# Patient Record
Sex: Male | Born: 1990 | Race: Black or African American | Marital: Single | Smoking: Current every day smoker
Health system: Southern US, Community
[De-identification: ages and names within clinical notes are randomized; demographics above are authoritative.]

## PROBLEM LIST (undated history)

## (undated) DIAGNOSIS — A749 Chlamydial infection, unspecified: Secondary | ICD-10-CM

---

## 2014-02-20 ENCOUNTER — Emergency Department (HOSPITAL_COMMUNITY)
Admission: EM | Admit: 2014-02-20 | Discharge: 2014-02-20 | Disposition: A | Discharge status: Home / Self Care | Payer: Self-pay | Attending: Emergency Medicine | Admitting: Emergency Medicine

## 2014-02-20 ENCOUNTER — Encounter (HOSPITAL_COMMUNITY): Payer: Self-pay | Admitting: Emergency Medicine

## 2014-02-20 ENCOUNTER — Emergency Department (HOSPITAL_COMMUNITY): Payer: Self-pay

## 2014-02-20 DIAGNOSIS — J069 Acute upper respiratory infection, unspecified: Secondary | ICD-10-CM | POA: Insufficient documentation

## 2014-02-20 DIAGNOSIS — R197 Diarrhea, unspecified: Secondary | ICD-10-CM | POA: Insufficient documentation

## 2014-02-20 MED ORDER — GUAIFENESIN-DM 100-10 MG/5ML PO SYRP: 5.0000 mL | ORAL_SOLUTION | ORAL | Status: DC | PRN

## 2014-02-20 MED ORDER — ALBUTEROL SULFATE HFA 108 (90 BASE) MCG/ACT IN AERS
2.0000 | INHALATION_SPRAY | Freq: Once | RESPIRATORY_TRACT | Status: AC
  Administered 2014-02-20: 2 via RESPIRATORY_TRACT
  Filled 2014-02-20: qty 6.7

## 2014-02-20 NOTE — ED Notes (Signed)
Back from xray

## 2014-02-20 NOTE — Discharge Instructions (Signed)
Use albuterol inhaler 2 puffs every 4-6 hours  Take cough medication as needed  Ibuprofen 600-800 mg for chest wall pain  Avoid tobacco use! Return to the emergency department if you develop any changing/worsening condition, fever, difficulty breathing, repeated vomiting, coughing up blood, or any other concerns (please read additional information regarding your condition below)     Upper Respiratory Infection, Adult An upper respiratory infection (URI) is also known as the common cold. It is often caused by a type of germ (virus). Colds are easily spread (contagious). You can pass it to others by kissing, coughing, sneezing, or drinking out of the same glass. Usually, you get better in 1 or 2 weeks.  HOME CARE   Only take medicine as told by your doctor.  Use a warm mist humidifier or breathe in steam from a hot shower.  Drink enough water and fluids to keep your pee (urine) clear or pale yellow.  Get plenty of rest.  Return to work when your temperature is back to normal or as told by your doctor. You may use a face mask and wash your hands to stop your cold from spreading. GET HELP RIGHT AWAY IF:   After the first few days, you feel you are getting worse.  You have questions about your medicine.  You have chills, shortness of breath, or brown or red spit (mucus).  You have yellow or brown snot (nasal discharge) or pain in the face, especially when you bend forward.  You have a fever, puffy (swollen) neck, pain when you swallow, or white spots in the back of your throat.  You have a bad headache, ear pain, sinus pain, or chest pain.  You have a high-pitched whistling sound when you breathe in and out (wheezing).  You have a lasting cough or cough up blood.  You have sore muscles or a stiff neck. MAKE SURE YOU:   Understand these instructions.  Will watch your condition.  Will get help right away if you are not doing well or get worse. Document Released: 04/06/2008  Document Revised: 01/11/2012 Document Reviewed: 02/23/2011 [facility] Patient Information 2014 [place], [place].   Cough, Adult  A cough is a reflex that helps clear your throat and airways. It can help heal the body or may be a reaction to an irritated airway. A cough may only last 2 or 3 weeks (acute) or may last more than 8 weeks (chronic).  CAUSES Acute cough:  Viral or bacterial infections. Chronic cough:  Infections.  Allergies.  Asthma.  Post-nasal drip.  Smoking.  Heartburn or acid reflux.  Some medicines.  Chronic lung problems (COPD).  Cancer. SYMPTOMS   Cough.  Fever.  Chest pain.  Increased breathing rate.  High-pitched whistling sound when breathing (wheezing).  Colored mucus that you cough up (sputum). TREATMENT   A bacterial cough may be treated with antibiotic medicine.  A viral cough must run its course and will not respond to antibiotics.  Your caregiver may recommend other treatments if you have a chronic cough. HOME CARE INSTRUCTIONS   Only take over-the-counter or prescription medicines for pain, discomfort, or fever as directed by your caregiver. Use cough suppressants only as directed by your caregiver.  Use a cold steam vaporizer or humidifier in your bedroom or home to help loosen secretions.  Sleep in a semi-upright position if your cough is worse at night.  Rest as needed.  Stop smoking if you smoke. SEEK IMMEDIATE MEDICAL CARE IF:   You have pus  in your sputum.  Your cough starts to worsen.  You cannot control your cough with suppressants and are losing sleep.  You begin coughing up blood.  You have difficulty breathing.  You develop pain which is getting worse or is uncontrolled with medicine.  You have a fever. MAKE SURE YOU:   Understand these instructions.  Will watch your condition.  Will get help right away if you are not doing well or get worse. Document Released: 04/17/2011 Document Revised: 01/11/2012  Document Reviewed: 04/17/2011 [facility] Patient Information 2014 [place], [place].  Chest Wall Pain Chest wall pain is pain in or around the bones and muscles of your chest. It may take up to 6 weeks to get better. It may take longer if you must stay physically active in your work and activities.  CAUSES  Chest wall pain may happen on its own. However, it may be caused by:  A viral illness like the flu.  Injury.  Coughing.  Exercise.  Arthritis.  Fibromyalgia.  Shingles. HOME CARE INSTRUCTIONS   Avoid overtiring physical activity. Try not to strain or perform activities that cause pain. This includes any activities using your chest or your abdominal and side muscles, especially if heavy weights are used.  Put ice on the sore area.  Put ice in a plastic bag.  Place a towel between your skin and the bag.  Leave the ice on for 15-20 minutes per hour while awake for the first 2 days.  Only take over-the-counter or prescription medicines for pain, discomfort, or fever as directed by your caregiver. SEEK IMMEDIATE MEDICAL CARE IF:   Your pain increases, or you are very uncomfortable.  You have a fever.  Your chest pain becomes worse.  You have new, unexplained symptoms.  You have nausea or vomiting.  You feel sweaty or lightheaded.  You have a cough with phlegm (sputum), or you cough up blood. MAKE SURE YOU:   Understand these instructions.  Will watch your condition.  Will get help right away if you are not doing well or get worse. Document Released: 10/19/2005 Document Revised: 01/11/2012 Document Reviewed: 06/15/2011 [facility] Patient Information 2014 [place], [place].  Smoking Cessation Quitting smoking is important to your health and has many advantages. However, it is not always easy to quit since nicotine is a very addictive drug. Often times, people try 3 times or more before being able to quit. This document explains the best ways for you to prepare to quit  smoking. Quitting takes hard work and a lot of effort, but you can do it. ADVANTAGES OF QUITTING SMOKING  You will live longer, feel better, and live better.  Your body will feel the impact of quitting smoking almost immediately.  Within 20 minutes, blood pressure decreases. Your pulse returns to its normal level.  After 8 hours, carbon monoxide levels in the blood return to normal. Your oxygen level increases.  After 24 hours, the chance of having a heart attack starts to decrease. Your breath, hair, and body stop smelling like smoke.  After 48 hours, damaged nerve endings begin to recover. Your sense of taste and smell improve.  After 72 hours, the body is virtually free of nicotine. Your bronchial tubes relax and breathing becomes easier.  After 2 to 12 weeks, lungs can hold more air. Exercise becomes easier and circulation improves.  The risk of having a heart attack, stroke, cancer, or lung disease is greatly reduced.  After 1 year, the risk of coronary heart disease is  cut in half.  After 5 years, the risk of stroke falls to the same as a nonsmoker.  After 10 years, the risk of lung cancer is cut in half and the risk of other cancers decreases significantly.  After 15 years, the risk of coronary heart disease drops, usually to the level of a nonsmoker.  If you are pregnant, quitting smoking will improve your chances of having a healthy baby.  The people you live with, especially any children, will be healthier.  You will have extra money to spend on things other than cigarettes. QUESTIONS TO THINK ABOUT BEFORE ATTEMPTING TO QUIT You may want to talk about your answers with your caregiver.  Why do you want to quit?  If you tried to quit in the past, what helped and what did not?  What will be the most difficult situations for you after you quit? How will you plan to handle them?  Who can help you through the tough times? Your family? Friends? A caregiver?  What  pleasures do you get from smoking? What ways can you still get pleasure if you quit? Here are some questions to ask your caregiver:  How can you help me to be successful at quitting?  What medicine do you think would be best for me and how should I take it?  What should I do if I need more help?  What is smoking withdrawal like? How can I get information on withdrawal? GET READY  Set a quit date.  Change your environment by getting rid of all cigarettes, ashtrays, matches, and lighters in your home, car, or work. Do not let people smoke in your home.  Review your past attempts to quit. Think about what worked and what did not. GET SUPPORT AND ENCOURAGEMENT You have a better chance of being successful if you have help. You can get support in many ways.  Tell your family, friends, and co-workers that you are going to quit and need their support. Ask them not to smoke around you.  Get individual, group, or telephone counseling and support. Programs are available at [employer] and health centers. Call your local health department for information about programs in your area.  Spiritual beliefs and practices may help some smokers quit.  Download a "quit meter" on your computer to keep track of quit statistics, such as how long you have gone without smoking, cigarettes not smoked, and money saved.  Get a self-help book about quitting smoking and staying off of tobacco. LEARN NEW SKILLS AND BEHAVIORS  Distract yourself from urges to smoke. Talk to someone, go for a walk, or occupy your time with a task.  Change your normal routine. Take a different route to work. Drink tea instead of coffee. Eat breakfast in a different place.  Reduce your stress. Take a hot bath, exercise, or read a book.  Plan something enjoyable to do every day. Reward yourself for not smoking.  Explore interactive web-based programs that specialize in helping you quit. GET MEDICINE AND USE IT  CORRECTLY Medicines can help you stop smoking and decrease the urge to smoke. Combining medicine with the above behavioral methods and support can greatly increase your chances of successfully quitting smoking.  Nicotine replacement therapy helps deliver nicotine to your body without the negative effects and risks of smoking. Nicotine replacement therapy includes nicotine gum, lozenges, inhalers, nasal sprays, and skin patches. Some may be available over-the-counter and others require a prescription.  Antidepressant medicine helps people abstain from  smoking, but how this works is unknown. This medicine is available by prescription.  Nicotinic receptor partial agonist medicine simulates the effect of nicotine in your brain. This medicine is available by prescription. Ask your caregiver for advice about which medicines to use and how to use them based on your health history. Your caregiver will tell you what side effects to look out for if you choose to be on a medicine or therapy. Carefully read the information on the package. Do not use any other product containing nicotine while using a nicotine replacement product.  RELAPSE OR DIFFICULT SITUATIONS Most relapses occur within the first 3 months after quitting. Do not be discouraged if you start smoking again. Remember, most people try several times before finally quitting. You may have symptoms of withdrawal because your body is used to nicotine. You may crave cigarettes, be irritable, feel very hungry, cough often, get headaches, or have difficulty concentrating. The withdrawal symptoms are only temporary. They are strongest when you first quit, but they will go away within 10 14 days. To reduce the chances of relapse, try to:  Avoid drinking alcohol. Drinking lowers your chances of successfully quitting.  Reduce the amount of caffeine you consume. Once you quit smoking, the amount of caffeine in your body increases and can give you symptoms, such  as a rapid heartbeat, sweating, and anxiety.  Avoid smokers because they can make you want to smoke.  Do not let weight gain distract you. Many smokers will gain weight when they quit, usually less than 10 pounds. Eat a healthy diet and stay active. You can always lose the weight gained after you quit.  Find ways to improve your mood other than smoking. FOR MORE INFORMATION  www.smokefree.gov  Document Released: 10/13/2001 Document Revised: 04/19/2012 Document Reviewed: 01/28/2012 [facility] Patient Information 2014 [place], [place].   Emergency Department Resource Guide 1) Find a Doctor and Pay Out of Pocket Although you won't have to find out who is covered by your insurance plan, it is a good idea to ask around and get recommendations. You will then need to call the office and see if the doctor you have chosen will accept you as a new patient and what types of options they offer for patients who are self-pay. Some doctors offer discounts or will set up payment plans for their patients who do not have insurance, but you will need to ask so you aren't surprised when you get to your appointment.  2) Contact Your Local Health Department Not all health departments have doctors that can see patients for sick visits, but many do, so it is worth a call to see if yours does. If you don't know where your local health department is, you can check in your phone book. The CDC also has a tool to help you locate your state's health department, and many state websites also have listings of all of their local health departments.  3) Find a Walk-in Clinic If your illness is not likely to be very severe or complicated, you may want to try a walk in clinic. These are popping up all over the country in pharmacies, drugstores, and shopping centers. They're usually staffed by nurse practitioners or physician assistants that have been trained to treat common illnesses and complaints. They're usually fairly quick and  inexpensive. However, if you have serious medical issues or chronic medical problems, these are probably not your best option.  No Primary Care Doctor: - Call Health Connect at  [phone] -  they can help you locate a primary care doctor that  accepts your insurance, provides certain services, etc. - Physician Referral Service- [phone]  Chronic Pain Problems: Organization         Address  Phone   Notes  [facility]  [phone] Patients need to be referred by their primary care doctor.   Medication Assistance: Organization         Address  Phone   Notes  [facility] [address] [place], [place] [address] [phone] --Must be a resident of [facility] -- Must have NO insurance coverage whatsoever (no Medicaid/ Medicare, etc.) -- The pt. MUST have a primary care doctor that directs their care regularly and follows them in the community   [facility]  [phone]   [employer]  [phone]    Agencies that provide inexpensive medical care: Organization         Address  Phone   Notes  [facility]  [phone]   [facility]    [phone]   [facility] [address] [place], [place] [address] [phone]   [facility] [address], [place] [phone]   Planned Parenthood    [phone]   [facility]    [phone]   [facility]  [address], [place] Phone:  [phone], Fax:  [phone] Hours of Operation:  9 am - 6 pm, M-F.  Also accepts Medicaid/Medicare and self-pay.  [facility]  [address], [place] Phone: [phone], Fax: [phone]. Hours of Operation:  8:30 am - 5:30 pm, M-F.  Also accepts Medicaid and self-pay.  [facility] [place] [address], [place] Phone: [phone]   [facility] [address], [place], [place] [phone], Ext. [phone] Mondays & Thursdays: 7-9 AM.  First 15 patients are seen on a first come, first serve basis.    Medicaid-accepting [facility] Providers:  Organization         Address  Phone   Notes  [facility] [address], [place] [phone] Also accepts self-pay patients.  [facility] [address], [place]  [phone]   [facility] [address], [place] [phone]   [facility] [address] [place] [address], [place] [phone]   Renaye RakersVeita Bland [address], [place]   [phone] Only accepts [place] Access [place] patients after they have their name applied to their card.   Self-Pay (no insurance) in [place] [facility] [place]:  Organization         Address  Phone   Notes  Sickle Cell Patients, [facility] [address], [place] [phone]   [facility] [address], [place] [phone]   [facility] [place]  [address], [place] [phone]   [facility]/Dr. Osei-Bonsu  [address] [place] [address], [place] or [id number], [place] [phone] Phone number for both [place] and [place] locations is the same.  [facility] [address], [place] [phone]   [facility] [address] [place] [address], [place] or [address] [phone]   [facility] [address] [place] [phone], phone; [phone], fax Sees patients 1st and 3rd Saturday of every month.  Must not qualify for public or private insurance (i.e. Medicaid, Medicare, [place] Health Choice, Veterans' Benefits)  Household income should be no more than 200% of the poverty level The clinic cannot treat you if you are pregnant or  think you are pregnant  Sexually transmitted diseases are not treated at the clinic.    Dental Care: Organization         Address  Phone  Notes  [facility] [address] [place], [place] [phone] Accepts children up to age 5 who are enrolled in [place] or [place] Health Choice; pregnant women with a Medicaid card; and children who have applied for Medicaid or [place] Health Choice, but were declined, whose parents can pay a reduced fee at time of service.  [facility] Department of [facility]  [address], [place] [phone] Accepts children up to age 64 who are enrolled in [place] or [place] Health Choice; pregnant women with a Medicaid card; and children who have applied for Medicaid or  Health Choice, but were declined, whose parents can pay a reduced fee at time of service.  [facility]  [address] [place], [place] [phone] Patients are seen by appointment only. Walk-ins are not accepted. [facility] will see patients 35 years of age and older. Monday - Tuesday (8am-5pm) Most Wednesdays (8:30-5pm) $30 per visit, cash only  [facility] Access PRO[facility]  [address], [facility] [phone] Patients are seen by appointment only. Walk-ins are not accepted. [facility] will see patients 1 years of age and older. One Wednesday Evening (Monthly: Volunteer Based).  $30 per visit, cash only  [employer]  [phone] for adults; Children under age 48, call Graduate Pediatric Dentistry at [phone]. Children aged 30-14, please call [phone] to request a pediatric application.  Dental services are provided in all areas of dental care including fillings, crowns and bridges, complete and partial dentures, implants, gum treatment, root canals, and extractions. Preventive care is also provided. Treatment is provided to both adults  and children. Patients are selected via a lottery and there is often a waiting list.   [facility] [address], [place]  [phone] [email or web address]   [facility] [address] [place], [place] [phone], Ext. [phone] Second and Fourth Thursday of each month, opens at 6:30 AM; Clinic ends at 9 AM.  Patients are seen on a first-come first-served basis, and a limited number are seen during each clinic.   [facility]  [address] [place], [place] [phone]   Eligibility Requirements You must have lived in [place], [place], or [place] counties for at least the last three months.   You cannot be eligible for state or federal sponsored [place], including [employer], [place], or [employer].   You generally cannot be eligible for healthcare insurance through your employer.    How to apply: Eligibility screenings are held every Tuesday and Wednesday afternoon from 1:00 pm until 4:00 pm. You do not need an appointment for the interview!  [facility] [address], [place], [place] [phone]   [facility] Health Department  [phone]   [facility]  Department  [phone]   [facility] Health Department  [phone]    Behavioral Health Resources in the Community: Intensive Outpatient Programs Organization         Address  Phone  Notes  [facility] [address], [place], [place] [phone]   [facility] [address], [place], [place] [phone]   [facility] [address], [place], [place]  [phone]   [facility] [address],  [place], [place] [phone] or [phone]   Substance Abuse Resources Organization         Address  Phone  Notes  [facility]  [phone]   [facility]  [phone]   [facility]  [phone]    [facility]  [phone]   Residential & Outpatient Substance Abuse Program  [phone]   Psychological Services Organization         Address  Phone  Notes  [facility]  [phone]   [facility]  [phone]   [facility] [address], [place] [phone] or [phone]    Mobile Crisis Teams Organization         Address  Phone  Notes  [facility], Mobile Crisis Care Unit  [phone]   [facility]  [address] [place], [place] [phone]   Doristine LocksSharon DeEsch [address] [place] [phone]    Self-Help/Support Groups Organization         Address  Phone             Notes  [facility] [place] - variety of support groups  336- [phone] Call for more information  Narcotics Anonymous (NA), [facility] [address], [place]  2 meetings at this location   [facility] Organization         Address  Phone  Notes  [facility] [address],    [place]  [phone]   [facility]  [address], [place] [phone], [place], [place] [phone]   [facility] [address], [place] [phone] Admissions: 8am-3pm M-F  [facility] [address]    [place], [place] [phone]   [facility] [address], [place], [place] [phone]   [facility] [address]  [place], [place] [phone]   [facility] Intensive Outpatient [address], [place], [place] [phone]   [facility] [address]  [place], [place] [phone] or [phone]   [facility] ([facility] [address] [place], [place] [phone] Accepts Medicaid  [facility] [address]  [place] [phone] Substance Abuse/Addiction Treatment   [facility]  [facility] Resources Organization         Address  Phone  Notes  [facility]  [phone]   Angie FavaJulie Brannon, PhD [address], [address] [place], [place]   [phone]) [phone] or [phone]) [phone]   [facility]   [address] [place], [place] [phone]) [phone]   [facility] [address], [place], [place] [phone]) [phone] Insurance/Medicaid/sponsorship through [facility]  [facility] [address]  [place], [place] [phone] [place], [place] [phone]    Dr. Adele Schilder  [phone]   [facility] 1) [address] [place] 2) [place] 3)  [address], [place] [phone]  [phone]   [place] [phone] or [phone] (After Hours)

## 2014-02-20 NOTE — ED Notes (Signed)
Patient transported to X-ray 

## 2014-02-20 NOTE — ED Notes (Signed)
Pt in c/o cough and congestion, chest pain with coughing, states it feels like when he had bronchitis last, denies fever, no distress noted

## 2014-02-20 NOTE — ED Notes (Signed)
Onset 3 days productive cough- yellow mucus, nasal congestion, runny nose- clear drainage, sore throat.  Onset yesterday chest pain with coughing and deep breaths, no episodes of chest pain since this am.  Onset yesterday lower back pain, worse with coughing.  Diarrhea x 1 last night.  Decreased appetite.  No respiratory or swallowing difficulties.  Had bronchitis in Jan, pt states his symptoms seem to be same as then.  Lungs clear.

## 2014-02-20 NOTE — ED Provider Notes (Signed)
CSN: [id number]     Arrival date & time 02/20/14  1934 History   This chart was scribed for non-physician practitioner, Coral CeoJessica Mandeep Kiser, PA-C, working with Gavin PoundMichael Y. Oletta LamasGhim, MD by Smiley HousemanFallon Davis, ED Scribe. This patient was seen in room TR11C/TR11C and the patient's care was started at 9:52 PM.   Chief Complaint  Patient presents with  . Cough   The history is provided by the patient. No language interpreter was used.   HPI Comments: Shannon Hardy is a 23 y.o. male with no PMH who presents to the Emergency Department complaining of a persistent cough that started about 4 days ago. Pt states his cough is productive of light yellow mucous. No hemoptysis. Pt has associated nasal congestion and scratchy throat from couging. Pt has mid-sternal chest pain only with coughing. Denies SOB or wheezing. Pt is experiencing diarrhea and states his appetite has decreased. Pt denies fevers, but states he has chills. ED temperature is 98.40F. Pt states he tried swallowing cayenne pepper without relief. Pt states he had similar symptoms when he had bronchitis in January. No history of DVT, PE, clotting disorder, cancer, recent surgery, immobilization, travel. He states he is otherwise healthy. Pt states he is a current smoker.    History reviewed. No pertinent past medical history. No past surgical history on file. No family history on file. History  Substance Use Topics  . Smoking status: Not on file  . Smokeless tobacco: Not on file  . Alcohol Use: Not on file    Review of Systems  Constitutional: Positive for chills and appetite change. Negative for fever and fatigue.  HENT: Positive for congestion. Negative for ear pain, sinus pressure and sore throat.   Respiratory: Positive for cough. Negative for chest tightness, shortness of breath and wheezing.   Cardiovascular: Positive for chest pain.  Gastrointestinal: Positive for diarrhea. Negative for nausea, vomiting and abdominal pain.  Musculoskeletal:  Negative for myalgias.  Skin: Negative for color change and rash.  Neurological: Negative for weakness and headaches.  All other systems reviewed and are negative.   Allergies  Review of patient's allergies indicates no known allergies.  Home Medications   Prior to Admission medications   Not on File   Triage Vitals: BP 133/78  Pulse 87  Temp(Src) 98.5 F (36.9 C)  Resp 20  Ht 6\' 2"  (1.88 m)  Wt 180 lb (81.647 kg)  BMI 23.10 kg/m2  SpO2 100%  Filed Vitals:   02/20/14 1941 02/20/14 2202  BP: 133/78 132/58  Pulse: 87 82  Temp: 98.5 F (36.9 C)   Resp: 20 20  Height: 6\' 2"  (1.88 m)   Weight: 180 lb (81.647 kg)   SpO2: 100% 99%    Physical Exam  Nursing note and vitals reviewed. Constitutional: He is oriented to person, place, and time. He appears well-developed and well-nourished. No distress.  Non-toxic  HENT:  Head: Normocephalic and atraumatic.  Right Ear: External ear normal.  Left Ear: External ear normal.  Nose: Nose normal.  Mouth/Throat: Oropharynx is clear and moist. No oropharyngeal exudate.  No erythema to the posterior pharynx. Tonsils without edema or exudates. Uvula midline. No trismus. No difficulty controlling secretions. Tympanic membranes gray and translucent bilaterally with no erythema, edema, or hemotympanum.  No mastoid or tragal tenderness bilaterally.   Eyes: Conjunctivae are normal. Pupils are equal, round, and reactive to light. Right eye exhibits no discharge. Left eye exhibits no discharge.  Neck: Normal range of motion. Neck supple.  No cervical lymphadenopathy.  No nuchal rigidity.   Cardiovascular: Normal rate, regular rhythm and normal heart sounds.  Exam reveals no gallop and no friction rub.   No murmur heard. Pulmonary/Chest: Effort normal and breath sounds normal. No respiratory distress. He has no wheezes. He has no rales. He exhibits tenderness.  Mid-sternal tenderness to palpation  Abdominal: Soft. He exhibits no distension.  There is no tenderness.  Musculoskeletal: Normal range of motion. He exhibits no edema and no tenderness.  No LE edema or calf tenderness bilaterally  Neurological: He is alert and oriented to person, place, and time.  Skin: Skin is warm and dry. He is not diaphoretic.    ED Course  Procedures (including critical care time) DIAGNOSTIC STUDIES: Oxygen Saturation is 100% on RA, normal by my interpretation.    COORDINATION OF CARE: 9:58 PM-Informed pt his chest x-ray was negative.  Will discharge with Robitussin and an inhaler.  Patient informed of current plan of treatment and evaluation and agrees with plan.    Imaging Review Dg Chest 2 View  02/20/2014   CLINICAL DATA:  COUGH  EXAM: CHEST  2 VIEW  COMPARISON:  None.  FINDINGS: The heart size and mediastinal contours are within normal limits. Both lungs are clear. The visualized skeletal structures are unremarkable.  IMPRESSION: No active cardiopulmonary disease.   Electronically Signed   By: Salome HolmesHector  Cooper M.D.   On: 02/20/2014 20:31    Date: 02/23/2014  Rate: 84  Rhythm: normal sinus rhythm  QRS Axis: normal  Intervals: normal  ST/T Wave abnormalities: normal  Conduction Disutrbances:none  Narrative Interpretation:   Old EKG Reviewed: none available   MDM   Shannon Hardy is a 23 y.o. male with no PMH who presents to the Emergency Department complaining of a persistent cough that started about 4 days ago. Symptoms likely due to a URI vs viral syndrome vs bronchitis. Patient afebrile and non-toxic in appearance. Chest x-ray negative for an acute cardiopulmonary process. EKG done triage. EKG negative for any acute ischemic changes. Chest pain unlikely cardiac. Reproducible on exam and only with coughing. Vital signs stable. No hypoxia, respiratory distress, or tachypnea. Patient had improvement with albuterol inhaler during ED visit. Instructed patient to follow-up with PCP for further evaluation and management. Return precautions,  discharge instructions, and follow-up was discussed with the patient before discharge.    Rechecks  10:00 PM = Patient states he feels better after inhaler. Breathing improved. Lungs clear to auscultation.    Discharge Medication List as of 02/20/2014 10:02 PM    START taking these medications   Details  guaiFENesin-dextromethorphan (ROBITUSSIN DM) 100-10 MG/5ML syrup Take 5 mLs by mouth every 4 (four) hours as needed for cough., Starting 02/20/2014, Until Discontinued, Print        Final impressions: 1. URI (upper respiratory infection)      Greer EeJessica Katlin Kennah Hehr PA-C         Jillyn LedgerJessica K Dequon Schnebly, PA-C 02/23/14 2251

## 2014-02-24 NOTE — ED Provider Notes (Signed)
Medical screening examination/treatment/procedure(s) were performed by non-physician practitioner and as supervising physician I was immediately available for consultation/collaboration.  Zayonna Ayuso Y. Herby Amick, MD 02/24/14 1420 

## 2014-08-18 ENCOUNTER — Emergency Department (HOSPITAL_COMMUNITY)
Admission: EM | Admit: 2014-08-18 | Discharge: 2014-08-18 | Disposition: A | Discharge status: Home / Self Care | Payer: Self-pay | Attending: Emergency Medicine | Admitting: Emergency Medicine

## 2014-08-18 ENCOUNTER — Encounter (HOSPITAL_COMMUNITY): Payer: Self-pay | Admitting: Emergency Medicine

## 2014-08-18 DIAGNOSIS — Z720 Tobacco use: Secondary | ICD-10-CM | POA: Insufficient documentation

## 2014-08-18 DIAGNOSIS — R369 Urethral discharge, unspecified: Secondary | ICD-10-CM | POA: Insufficient documentation

## 2014-08-18 MED ORDER — AZITHROMYCIN 250 MG PO TABS
1000.0000 mg | ORAL_TABLET | Freq: Once | ORAL | Status: AC
  Administered 2014-08-18: 1000 mg via ORAL
  Filled 2014-08-18: qty 4

## 2014-08-18 MED ORDER — CEFTRIAXONE SODIUM 1 G IJ SOLR
1.0000 g | Freq: Once | INTRAMUSCULAR | Status: AC
  Administered 2014-08-18: 1 g via INTRAMUSCULAR
  Filled 2014-08-18: qty 10

## 2014-08-18 MED ORDER — ONDANSETRON 4 MG PO TBDP
4.0000 mg | ORAL_TABLET | Freq: Once | ORAL | Status: AC
  Administered 2014-08-18: 4 mg via ORAL
  Filled 2014-08-18: qty 1

## 2014-08-18 MED ORDER — LIDOCAINE HCL (PF) 1 % IJ SOLN
5.0000 mL | Freq: Once | INTRAMUSCULAR | Status: AC
  Administered 2014-08-18: 5 mL via INTRADERMAL
  Filled 2014-08-18: qty 5

## 2014-08-18 NOTE — Discharge Instructions (Signed)

## 2014-08-18 NOTE — ED Notes (Signed)
Noticed penile d/c today, denies other sx, reports unprotected sex with symptomatic person.

## 2014-08-18 NOTE — ED Provider Notes (Signed)
CSN: [id number]     Arrival date & time 08/18/14  2140 History  This chart was scribed for non-physician practitioner, Arthor CaptainAbigail Sahithi Ordoyne, PA-C,working with Flint MelterElliott L [place]z, MD, by Karle PlumberJennifer Tensley, ED Scribe. This patient was seen in room TR09C/TR09C and the patient's care was started at 10:27 PM.  Chief Complaint  Patient presents with  . Penile Discharge   Patient is a 23 y.o. male presenting with penile discharge. The history is provided by the patient. No language interpreter was used.  Penile Discharge   HPI Comments:  Shannon Hardy is a 23 y.o. male who presents to the Emergency Department complaining of clear penile discharge that the pt started today. He reports his partner reports she started having symptoms two days ago. Reports this has been his only sexual partner. He denies testicular pain, swelling or penile lesions.  History reviewed. No pertinent past medical history. History reviewed. No pertinent past surgical history. No family history on file. History  Substance Use Topics  . Smoking status: Current Every Day Smoker  . Smokeless tobacco: Not on file  . Alcohol Use: No    Review of Systems  Genitourinary: Positive for discharge.    Allergies  Review of patient's allergies indicates no known allergies.  Home Medications   Prior to Admission medications   Not on File   Triage Vitals: BP 134/88  Pulse 74  Temp(Src) 98.1 F (36.7 C) (Oral)  Resp 12  Ht 6\' 2"  (1.88 m)  Wt 200 lb (90.719 kg)  BMI 25.67 kg/m2  SpO2 98% Physical Exam  Nursing note and vitals reviewed. Constitutional: He is oriented to person, place, and time. He appears well-developed and well-nourished.  HENT:  Head: Normocephalic and atraumatic.  Eyes: EOM are normal.  Neck: Normal range of motion.  Cardiovascular: Normal rate.   Pulmonary/Chest: Effort normal.  Genitourinary: Testes normal and penis normal. No penile tenderness. No discharge found.  Normal genitalia with no lesions  present.  Musculoskeletal: Normal range of motion.  Neurological: He is alert and oriented to person, place, and time.  Skin: Skin is warm and dry.  Psychiatric: He has a normal mood and affect. His behavior is normal.    ED Course  Procedures (including critical care time) DIAGNOSTIC STUDIES: Oxygen Saturation is 98% on RA, normal by my interpretation.   COORDINATION OF CARE: 10:37 PM- Will check for GC/chlamydia and treat. Pt verbalizes understanding and agrees to plan.  Medications  azithromycin (ZITHROMAX) tablet 1,000 mg (1,000 mg Oral Given 08/18/14 2253)  ondansetron (ZOFRAN-ODT) disintegrating tablet 4 mg (4 mg Oral Given 08/18/14 2254)  cefTRIAXone (ROCEPHIN) injection 1 g (1 g Intramuscular Given 08/18/14 2254)  lidocaine (PF) (XYLOCAINE) 1 % injection 5 mL (5 mLs Intradermal Given 08/18/14 2301)    Labs Review Labs Reviewed  GC/CHLAMYDIA PROBE AMP  RPR  HIV ANTIBODY (ROUTINE TESTING)    Imaging Review No results found.   EKG Interpretation None      MDM   Final diagnoses:  Penile discharge   Patient here with c/o penile discharge. Treated with 1 g azithro and IM rocephin for chlamydia/GC. Discussed need for treatment of partner(s). Will be notified if  Positive results. May check my chart.     I personally performed the services described in this documentation, which was scribed in my presence. The recorded information has been reviewed and is accurate.   Arthor CaptainAbigail Caryle Helgeson, PA-C 08/22/14 1213

## 2014-08-19 LAB — RPR

## 2014-08-19 LAB — HIV ANTIBODY (ROUTINE TESTING W REFLEX): HIV: NONREACTIVE

## 2014-08-20 LAB — GC/CHLAMYDIA PROBE AMP
CT PROBE, AMP APTIMA: NEGATIVE
GC Probe RNA: NEGATIVE

## 2014-08-23 NOTE — ED Provider Notes (Signed)
Medical screening examination/treatment/procedure(s) were performed by non-physician practitioner and as supervising physician I was immediately available for consultation/collaboration.   EKG Interpretation None       Aikeem Lilley L Marcellis Frampton, MD 08/23/14 2054 

## 2015-09-23 IMAGING — CR DG CHEST 2V
2 series · 2 of 2 positions shown · non-contrast
Comparison: None.

CLINICAL DATA: COUGH

EXAM:
CHEST  2 VIEW

[w chest pa]
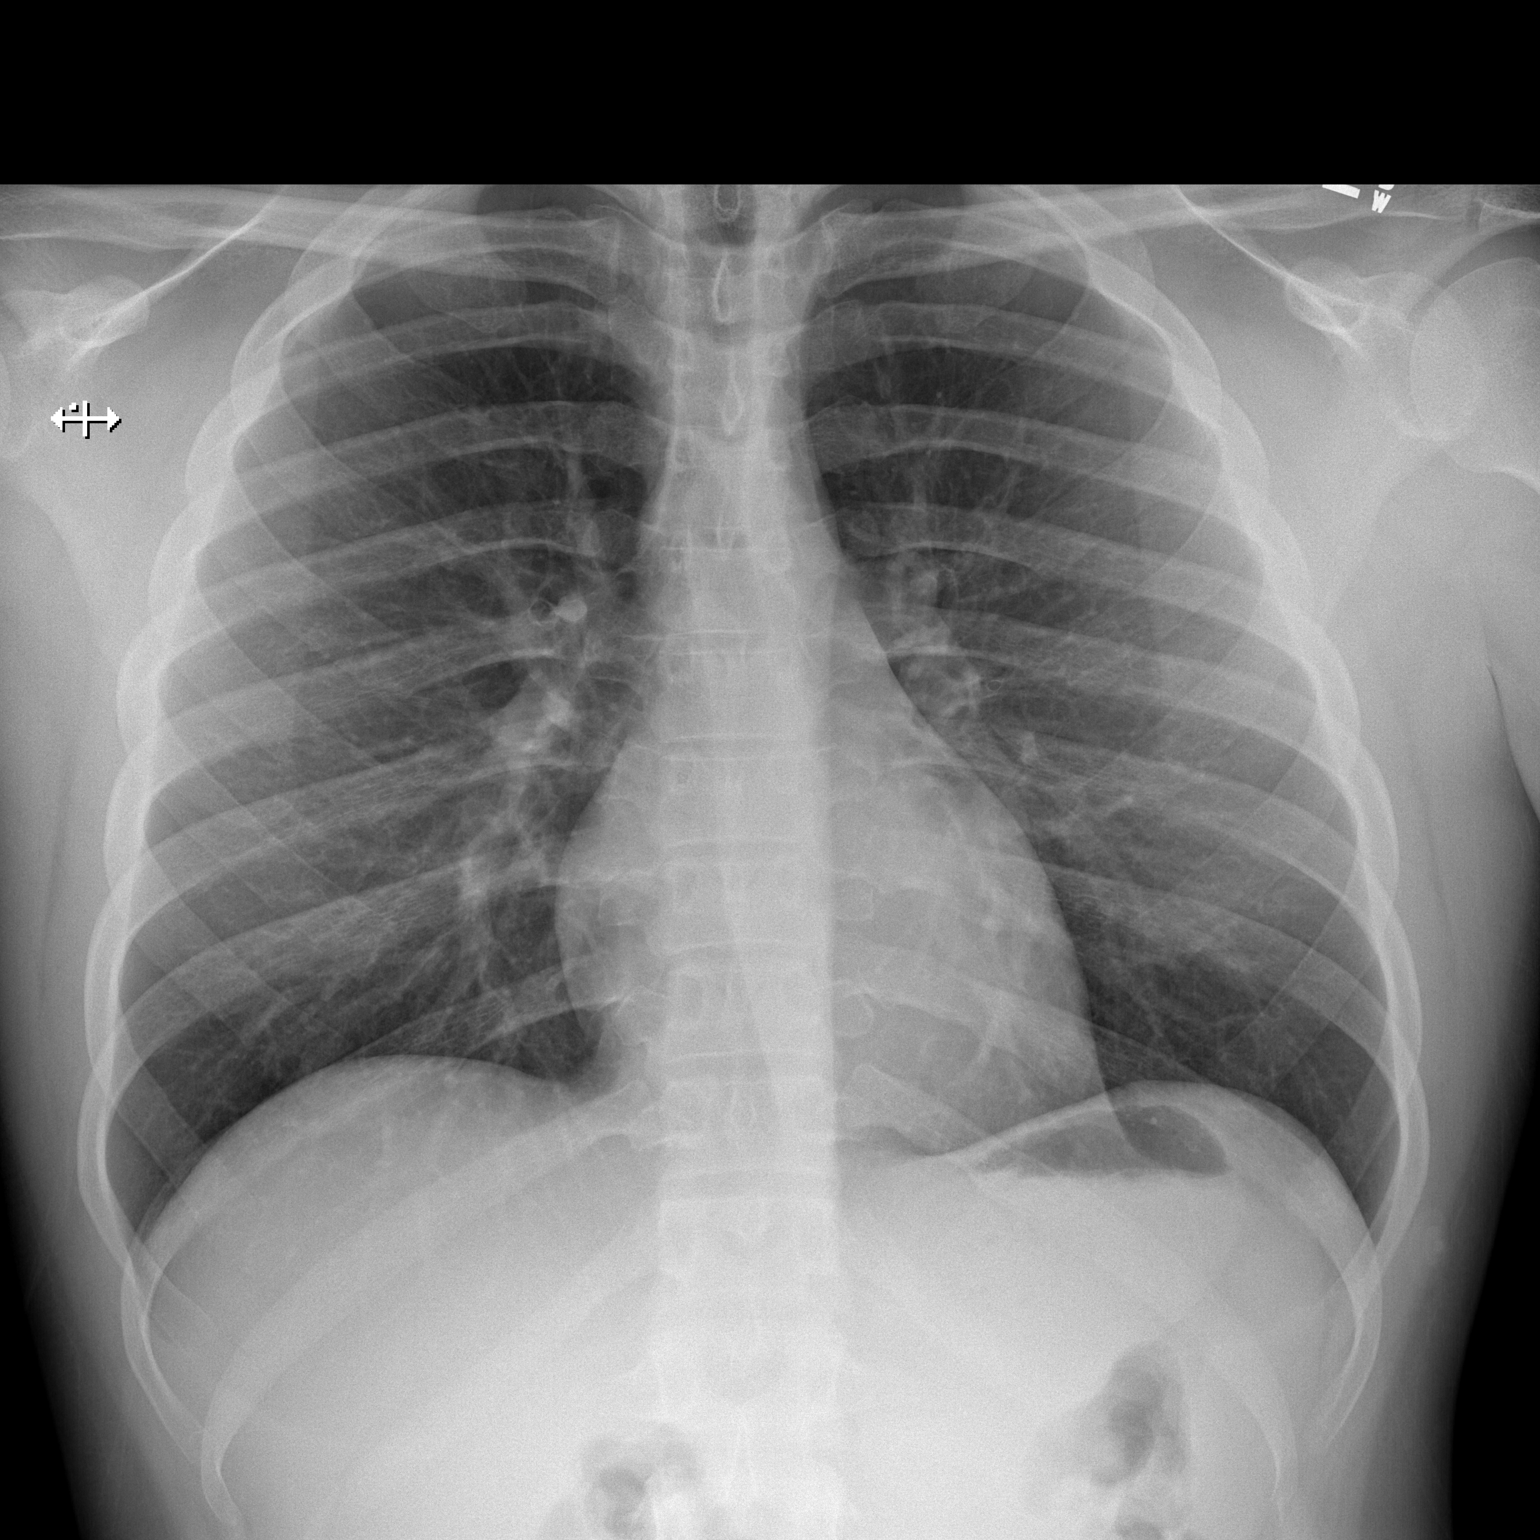

[w chest lat]
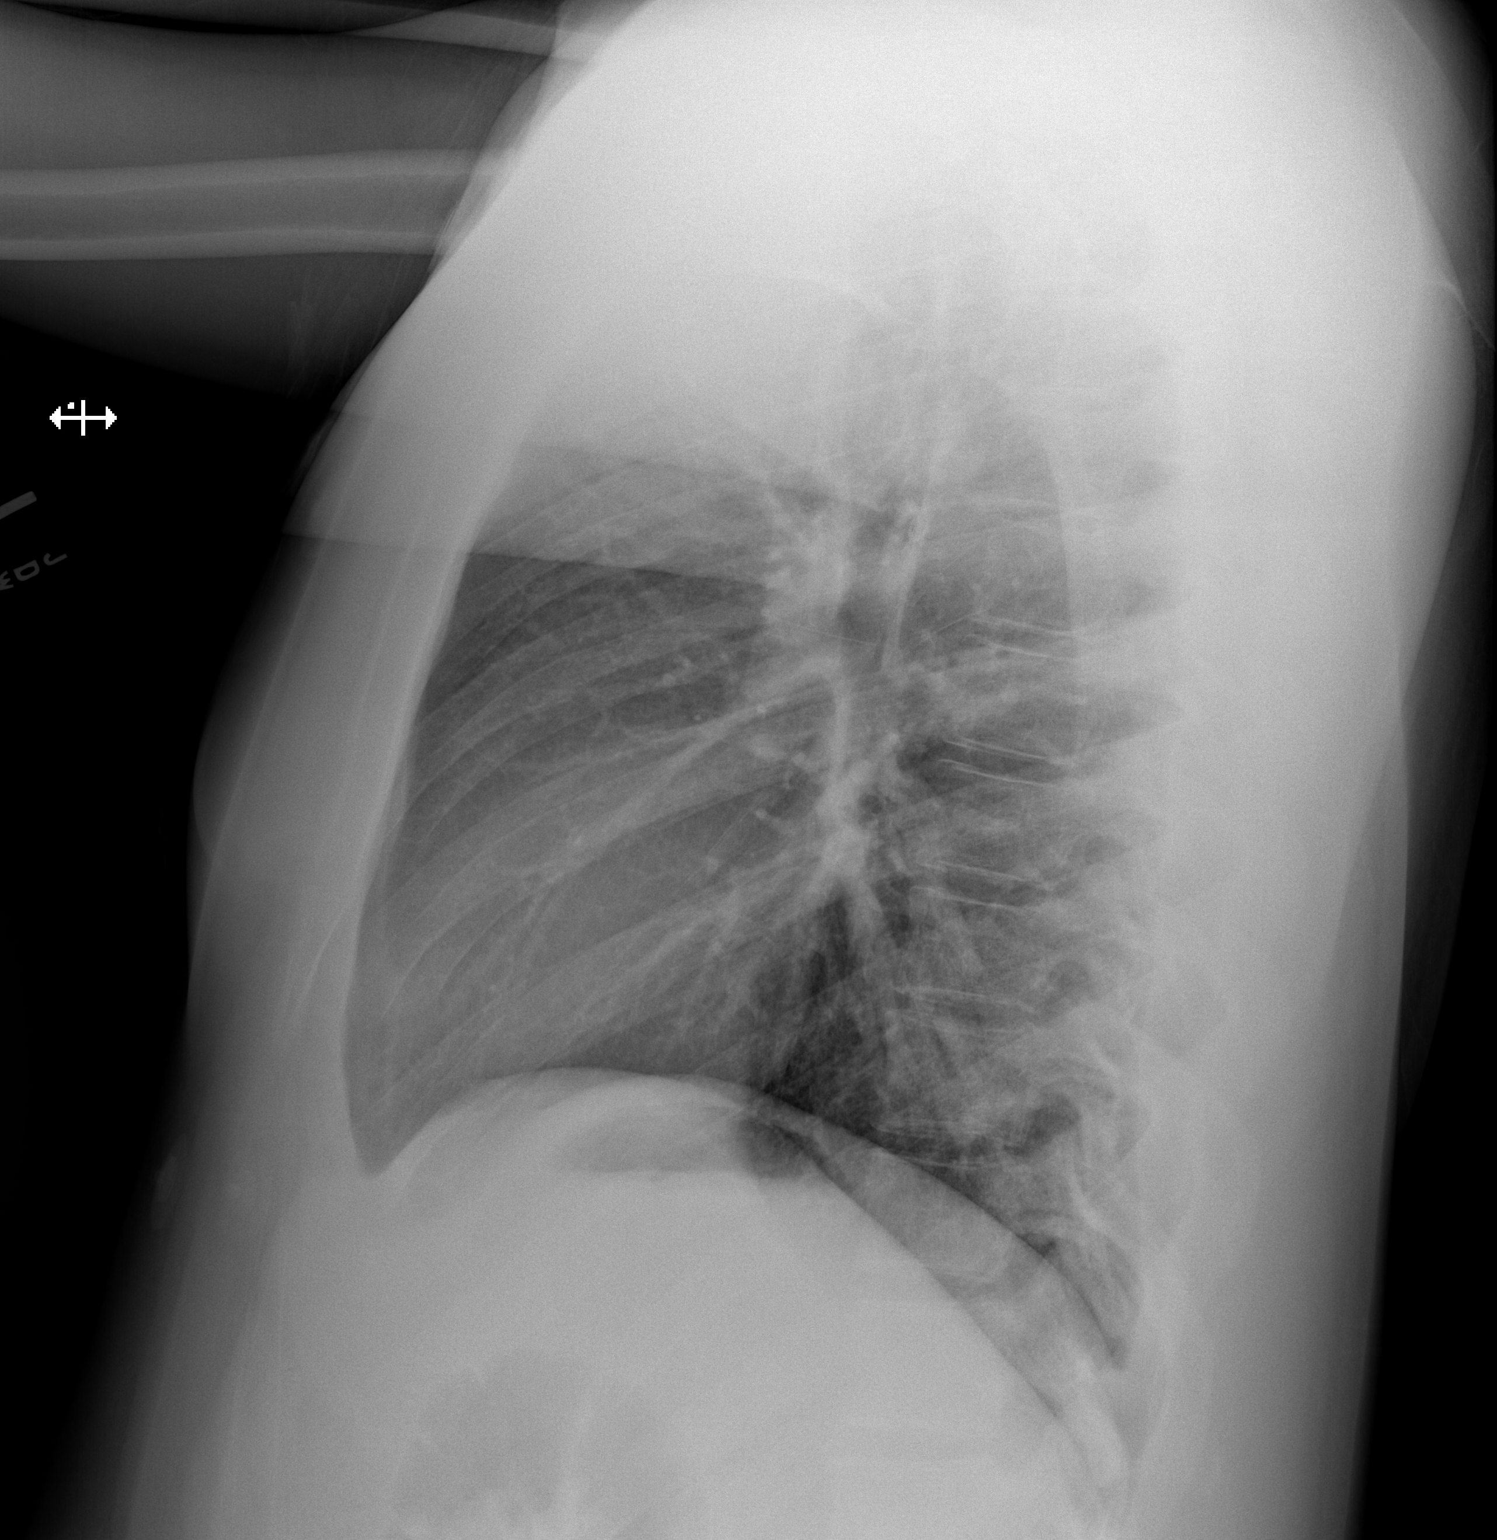

[2 of 2 positions shown; findings below may reference images not displayed]

FINDINGS: The heart size and mediastinal contours are within normal limits.
Both lungs are clear. The visualized skeletal structures are
unremarkable.
IMPRESSION: No active cardiopulmonary disease.

## 2018-04-13 ENCOUNTER — Emergency Department (HOSPITAL_COMMUNITY)
Admission: EM | Admit: 2018-04-13 | Discharge: 2018-04-13 | Disposition: A | Discharge status: Home / Self Care | Payer: Self-pay | Attending: Emergency Medicine | Admitting: Emergency Medicine

## 2018-04-13 ENCOUNTER — Encounter (HOSPITAL_COMMUNITY): Payer: Self-pay | Admitting: Emergency Medicine

## 2018-04-13 DIAGNOSIS — R369 Urethral discharge, unspecified: Secondary | ICD-10-CM | POA: Insufficient documentation

## 2018-04-13 DIAGNOSIS — F1210 Cannabis abuse, uncomplicated: Secondary | ICD-10-CM | POA: Insufficient documentation

## 2018-04-13 DIAGNOSIS — F17200 Nicotine dependence, unspecified, uncomplicated: Secondary | ICD-10-CM | POA: Insufficient documentation

## 2018-04-13 DIAGNOSIS — B379 Candidiasis, unspecified: Secondary | ICD-10-CM | POA: Insufficient documentation

## 2018-04-13 DIAGNOSIS — Z711 Person with feared health complaint in whom no diagnosis is made: Secondary | ICD-10-CM | POA: Insufficient documentation

## 2018-04-13 HISTORY — DX: Chlamydial infection, unspecified: A74.9

## 2018-04-13 LAB — WET PREP, GENITAL
Clue Cells Wet Prep HPF POC: NONE SEEN
Sperm: NONE SEEN
TRICH WET PREP: NONE SEEN
WBC, Wet Prep HPF POC: NONE SEEN

## 2018-04-13 LAB — URINALYSIS, ROUTINE W REFLEX MICROSCOPIC
BILIRUBIN URINE: NEGATIVE
Glucose, UA: NEGATIVE mg/dL
Hgb urine dipstick: NEGATIVE
Ketones, ur: NEGATIVE mg/dL
Leukocytes, UA: NEGATIVE
NITRITE: NEGATIVE
Protein, ur: NEGATIVE mg/dL
Specific Gravity, Urine: 1.025 (ref 1.005–1.030)
pH: 7 (ref 5.0–8.0)

## 2018-04-13 MED ORDER — CEFTRIAXONE SODIUM 250 MG IJ SOLR
250.0000 mg | Freq: Once | INTRAMUSCULAR | Status: AC
  Administered 2018-04-13: 250 mg via INTRAMUSCULAR
  Filled 2018-04-13: qty 250

## 2018-04-13 MED ORDER — FLUCONAZOLE 150 MG PO TABS
150.0000 mg | ORAL_TABLET | Freq: Once | ORAL | Status: AC
  Administered 2018-04-13: 150 mg via ORAL
  Filled 2018-04-13: qty 1

## 2018-04-13 MED ORDER — AZITHROMYCIN 250 MG PO TABS
1000.0000 mg | ORAL_TABLET | Freq: Once | ORAL | Status: AC
  Administered 2018-04-13: 1000 mg via ORAL
  Filled 2018-04-13: qty 4

## 2018-04-13 MED ORDER — LIDOCAINE HCL 1 % IJ SOLN
INTRAMUSCULAR | Status: AC
  Administered 2018-04-13: 20 mL
  Filled 2018-04-13: qty 20

## 2018-04-13 MED ORDER — CLOTRIMAZOLE 1 % EX CREA: TOPICAL_CREAM | CUTANEOUS | 0 refills | Status: AC

## 2018-04-13 NOTE — Discharge Instructions (Addendum)
You are seen in the emergency department today for concerns regarding itching and redness to your penis.  Your wet prep shows kinase consistent with yeast.  We gave you a tablet to help treat the yeast in the ER and we are sending you home with a topical cream to place to the tip of your penis 2 times per day until symptoms resolve.  We additionally tested you for gonorrhea and chlamydia, we will call you with those results.  We treated you for gonorrhea and chlamydia in the ER to ensure that you have coverage for this.  If your results are positive you will need to inform all sexual partners.  We recommend avoiding intercourse for the next 1 week to ensure resolution of all potential conditions.  We recommend follow-up with the health department in 1 week for reevaluation of your symptoms.  Return to the ER for new or worsening symptoms including but not limited to fever, worsening itching, testicular pain or swelling, abdominal pain, vomiting, or any other concerns that you may have.

## 2018-04-13 NOTE — ED Triage Notes (Signed)
Patient reports penis itching x3 days. Denies discharge and urinary sx. Denies exposure to STD. Reports sx mimic previous sx when diagnosed with chlamydia.

## 2018-04-13 NOTE — ED Provider Notes (Signed)
[facility] Note   CSN: [id number] Arrival date & time: 04/13/18  1931     History   Chief Complaint penile itching   HPI Shannon Hardy is a 27 y.o. male with a hx of tobacco abuse and chlamydia who presents to the ED with complaints of penile pruritus x 5 days. Patient states that the glans penis has been somewhat pruritic and red, non painful, no lesions. He has had some clear discharge after urinating. No specific alleviating/aggravating factors. Has not tried at home intervention. He states this feels somewhat similar to previous chlamydia. He reports he is sexually active with his girlfriend utilizing condoms. He reports he does have concern for possible STD. Denies fever, chills, nausea, vomiting, abdominal pain, dysuria, urgency, frequency, testicular pain/swelling, or pain with bowel movements.   HPI  Past Medical History:  Diagnosis Date  . Chlamydia     There are no active problems to display for this patient.   History reviewed. No pertinent surgical history.      Home Medications    Prior to Admission medications   Not on File    Family History No family history on file.  Social History Social History   Tobacco Use  . Smoking status: Current Every Day Smoker  Substance Use Topics  . Alcohol use: No  . Drug use: Yes    Types: Marijuana     Allergies   Patient has no known allergies.   Review of Systems Review of Systems  Constitutional: Negative for chills and fever.  Gastrointestinal: Negative for abdominal pain, blood in stool, diarrhea, nausea, rectal pain and vomiting.  Genitourinary: Positive for discharge. Negative for dysuria, frequency, genital sores, hematuria, penile pain, scrotal swelling, testicular pain and urgency.       Positive for redness/pruritis to glans penis  Musculoskeletal: Negative for back pain.   Physical Exam Updated Vital Signs BP 129/81 (BP Location: Left Arm)    Pulse 72   Temp 98.9 F (37.2 C) (Oral)   Resp 16   Ht 6\' 2"  (1.88 m)   Wt 86.9 kg (191 lb 9.6 oz)   SpO2 98%   BMI 24.60 kg/m   Physical Exam  Constitutional: He appears well-developed and well-nourished. No distress.  HENT:  Head: Normocephalic and atraumatic.  Eyes: Conjunctivae are normal. Right eye exhibits no discharge. Left eye exhibits no discharge.  Cardiovascular: Normal rate and regular rhythm.  Pulmonary/Chest: Effort normal and breath sounds normal.  Abdominal: Soft. He exhibits no distension. There is no tenderness. There is no rebound and no guarding.  Genitourinary: Testes normal. Cremasteric reflex is present. Right testis shows no mass, no swelling and no tenderness. Left testis shows no mass, no swelling and no tenderness. Circumcised.  Genitourinary Comments: There is mild erythema at the urethral meatus and somewhat to glans penis, no lesions, no scaly appearance. Minimal fairly clear to white discharge at the urethral meatus. EDT Rennis GoldenLubin, Alicia present as chaperone.   Lymphadenopathy: No inguinal adenopathy noted on the right or left side.  Neurological: He is alert.  Clear speech.   Psychiatric: He has a normal mood and affect. His behavior is normal. Thought content normal.  Nursing note and vitals reviewed.  ED Treatments / Results  Labs Results for orders placed or performed during the hospital encounter of 04/13/18  Wet prep, genital  Result Value Ref Range   Yeast Wet Prep HPF POC PRESENT (A) NONE SEEN   Trich, Wet Prep NONE SEEN NONE  SEEN   Clue Cells Wet Prep HPF POC NONE SEEN NONE SEEN   WBC, Wet Prep HPF POC NONE SEEN NONE SEEN   Sperm NONE SEEN   Urinalysis, Routine w reflex microscopic  Result Value Ref Range   Color, Urine YELLOW YELLOW   APPearance CLEAR CLEAR   Specific Gravity, Urine 1.025 1.005 - 1.030   pH 7.0 5.0 - 8.0   Glucose, UA NEGATIVE NEGATIVE mg/dL   Hgb urine dipstick NEGATIVE NEGATIVE   Bilirubin Urine NEGATIVE NEGATIVE    Ketones, ur NEGATIVE NEGATIVE mg/dL   Protein, ur NEGATIVE NEGATIVE mg/dL   Nitrite NEGATIVE NEGATIVE   Leukocytes, UA NEGATIVE NEGATIVE   No results found. EKG None  Radiology No results found.  Procedures Procedures (including critical care time)  Medications Ordered in ED Medications  cefTRIAXone (ROCEPHIN) injection 250 mg (has no administration in time range)  azithromycin (ZITHROMAX) tablet 1,000 mg (has no administration in time range)  fluconazole (DIFLUCAN) tablet 150 mg (has no administration in time range)    Initial Impression / Assessment and Plan / ED Course  I have reviewed the triage vital signs and the nursing notes.  Pertinent labs & imaging results that were available during my care of the patient were reviewed by me and considered in my medical decision making (see chart for details).    Patient presents with concerns for penile pruritus, erythema, and penile discharge.  Patient is nontoxic appearing, in no apparent distress, vitals WNL.  Patient is afebrile without abdominal tenderness/pain or painful bowel movements to indicate prostatitis.  No tenderness to palpation of the testes or epididymis to suggest orchitis or epididymitis. Mild erythema to urethral meatus and glans penis, no appreciable lesions, open wounds, or scaly appearance. There is a small amount of white/clear discharge present. Offered evaluation for HIV/syphilis, patient declined.  Wet prep with yeast, urine without obvious infection, urine and GC/chlamydia cultures pending. Patient aware he has cultures pending and that he will need to inform all sexual partners if positive for GC/chlamydia. Treated prophylactically for gonorrhea/chlamydia in the ER per patient's request. Also given diflucan in the ER and prescription for topical clotrimazole for yeast. Discussed importance of using protection when sexually active. I discussed results, treatment plan, need for PCP/health department follow-up, and  return precautions with the patient. Provided opportunity for questions, patient confirmed understanding and is in agreement with plan.   Final Clinical Impressions(s) / ED Diagnoses   Final diagnoses:  Candida infection  Concern about STD in male without diagnosis    ED Discharge Orders        Ordered    clotrimazole (LOTRIMIN) 1 % cream     04/13/18 2206       Petrucelli, Pleas Koch, PA-C 04/13/18 2301    Mesner, Barbara Cower, MD 04/13/18 2333

## 2018-04-14 LAB — GC/CHLAMYDIA PROBE AMP (~~LOC~~) NOT AT ARMC
Chlamydia: POSITIVE — AB
NEISSERIA GONORRHEA: NEGATIVE

## 2018-04-15 LAB — URINE CULTURE: Culture: NO GROWTH

## 2019-11-05 NOTE — ED Provider Notes (Signed)
 History & Physical   Chief Complaint  Edema (pt c/o feeling fatigue  and swelling to his bilateral ankles since yesterday, denies any CP/SOB) - as stated in triage    History of Present Illness  Shannon Hardy is a 29 y.o. male without significant medical history who comes in with complaints of swelling in his feet and ankles that he noticed this morning.  He states that he has been up on his feet a lot in the last few days but has not had anything like this happen before.  Patient was seen here for chest pain several days ago and had unremarkable EKG and chest x-ray but has not had blood work or urine test done in a while.  Patient denies shortness of breath or chest pain.  No fever or chills.  No coronavirus concerns.  Denies rashes or joint aches.  No back pain.  No abdominal pain.  Denies nausea vomiting or diarrhea recently.  No problems urinating except he states he has not been urinating as much as he usually does.  Patient smokes less than a pack per day.  Denies street drug use.  Does not drink much alcohol.  The historian is the patient  Review of Systems: See HPI, all other systems reviewed and are otherwise negative Constitutional: No weight loss Eyes: No eye drainage HENT: No hearing change Cardiac: No chest pain Respiratory: No shortness of breath Gastrointestinal: No vomiting or diarrhea Genitourinary: No dysuria Musculoskeletal:  See History of Present Illness Skin: No cyanosis, No rashes Allergic/Immunologic: No hives Neurological: No seizures   Allergies: Patient has no known allergies.  Past Medical History: He  has no past medical history on file.  Past Surgical History: He  has a past surgical history that includes Neck surgery.   Medications : Not on File   Family History: His family history is not on file.  Social History: He  reports that he has been smoking. He has never used smokeless tobacco. He reports previous alcohol use. He reports that  he does not use drugs.  Physical Exam Adult Medical Exam   Reviewed vital signs and nursing note as charted by RN.  CONSTITUTIONAL: Well-appearing, well-nourished HEAD: Normocephalic; atraumatic EYES: PERRL; Conjunctivae clear, no drainage ENT: Pharynx without erythema, no lesions, no tonsillar hypertrophy, airway patent, mucous membranes pink and moist NECK: Supple, no cervical lymphadenopathy, no masses CARD: Regular rate and rhythm; no murmurs, no gallops, symmetric pulses RESP:  Respiratory rate and effort are normal. There is normal chest excursion. No wheezing. No rales, No rhonchi ABD/GI: Normal bowel sounds; non-distended; soft, non-tender, no rebound, no guarding, no palpable organomegaly BACK: No midline tenderness, no CVA tenderness, no soft tissue swelling,  EXT: Normal ROM in all joints; patient has mild generalized swelling to both feet and ankle areas without pitting edema.  There is no erythema.  No ecchymosis.  No specific area of tenderness.  There is no calf tenderness or swelling. SKIN: Normal color for age and race; warm; dry; good turgor; no acute lesions noted PSYCH: Behavior is appropriate; well groomed   Results I have personally reviewed the following lab, radiology and other results  Results for orders placed or performed during the hospital encounter of 11/05/19  Urinalysis Complete  Result Value Ref Range   Color YELLOW Lt Yellow or Yellow   Urine Clarity HAZY (A) Clear   Urine Specific Gravity 1.020 1.003 - 1.035   Urine pH 7.0 5.0 - 8.0   Urine Albumin NEGATIVE Negative  Urine Glucose Screen NEGATIVE Negative   Urine Ketones NEGATIVE Negative   Urine Bilirubin NEGATIVE Negative   Urine Hemoglobin NEGATIVE Negative   Urine Leukocyte Esterase NEGATIVE Negative   Urine Nitrite NEGATIVE Negative   Urine Urobilinogen 2 (A) 0.2 - 1.0 mg/dL   Urine Bacteria TRACE Trace /HPF   Urine RBC <3 <3 /HPF   Urine WBC <5 <5 /HPF   Urine Hyaline Casts 1-5 0 - 5  /LPF  BMP  Result Value Ref Range   Sodium 138 136 - 145 mmol/L   Potassium 4.4 3.5 - 5.1 mmol/L   Chloride 106 99 - 108 mmol/L   CO2 25 21 - 31 mmol/L   BUN 11 7 - 25 mg/dL   Creatinine 9.14 9.32 - 1.20 mg/dL   Glucose, Random 97 70 - 199 mg/dL   Calcium, Total 8.9 8.8 - 10.6 mg/dL   Osmolality (calculated) 275 270 - 295 mOsm/kg   Anion Gap 7 3 - 11  CBC with Platelet and Differential  Result Value Ref Range   Differential Percent Diff %    Differential Absolute Diff Absolute    WBC 7.1 3.6 - 11.2 K/uL   RBC 4.34 4.06 - 5.63 M/uL   Hemoglobin 12.8 12.5 - 17.2 g/dL   Hematocrit 39 37 - 47 %   Mean Cell Volume 89 74 - 96 fL   Mean Cell Hemoglobin 29 26 - 33 pg   Mean Cell Hemoglobin Concentration 33 33 - 36 g/dL   RDW 87.0 87.6 - 82.9 %   Platelet Count 207 150 - 450 K/uL   Mean Platelet Volume 8.0 7.5 - 11.2 fL   Neutrophils 53 37 - 80 %   Lymphocytes 35 10 - 50 %   Monocytes 7 0 - 12 %   Eosinophils 4 0 - 7 %   Basophils 1 0 - 2 %   Neutrophils Absolute 3.8 1.3 - 9.0 K/uL   Lymphocytes Absolute 2.5 0.4 - 5.6 K/uL   Monocytes Absolute 0.5 0.0 - 1.3 K/uL   Eosinophils Absolute 0.3 0.0 - 0.8 K/uL   Basophils Absolute 0.0 0.0 - 0.2 K/uL   Nucleated RBCS 0 /100 WBC  Hepatic Function Panel  Result Value Ref Range   Albumin 4.1 3.5 - 5.7 g/dL   Bilirubin, Total 0.4 0.3 - 1.0 mg/dL   Bilirubin, Direct 0.1 0.0 - 0.2 mg/dL   Alkaline Phosphatase 59 34 - 104 IU/L   Protein, Total 6.6 6.4 - 8.9 g/dL   ALT 18 7 - 52 IU/L   AST 21 13 - 39 IU/L   Albumin/Globulin Ratio 1.6 1.2 - 2.3  EGFR (MDRD)  Result Value Ref Range   EGFR >60 mL/min/1.52m2                  Medical Decision Making & ED course Patient without symptoms apart from swelling to the feet and ankles.  There is no appreciable involvement to the shins or calves.  Do not suspect cardiac or renal or hepatic issues.  Blood work and urinalysis unremarkable.  No apparent respiratory issues.  Denies chest pain.  No  fevers or chills.  Abdomen is benign. At this point will give a single dose orally of Lasix here to help with his current symptoms and recommend follow-up through the [facility].     Progress Notes Portions of this record may be dictated using voice recognition software. Variances in spelling and vocabulary are possible and unintentional. Some errors  may not be recognized or corrected at time of dictation.    Cordella Lynwood Elder, MD 11/05/19 [phone]

## 2020-01-24 NOTE — ED Provider Notes (Signed)
 [facility] Emergency Department Provider Note Room: A 07/A 07  Medical Decision Making   Shannon Hardy is a 29 y.o. male presenting with above complaints.  Low suspicion for cardiac etiology, only known cardiac risk factor is that he smokes.  We will proceed with cardiac labs.  Progress Notes   PERC Rule for risk stratifying PE to low risk (calculator) Background Risk stratifies patients to low risk of pulmonary embolism if there is a low pretest probability of PE and if all 8 criteria are present including age <50, heart rate <100, O2 Sat >94%, no unilateral leg edema, no hemoptysis, no recent surgery or trauma, no prior VTE, and no hormone use.  Data He is 29 y.o. He  has no past medical history on file.Shannon Hardy He does not have a problem list on file. He currently has no medications in their medication list. He  has a past surgical history that includes Neck surgery. Heart Rate: 71 SpO2: 97 %  Criteria Of  8 possible items (all criteria must be present): Heart rate <100 bpm, Oxygen Saturation >94%, No unilateral leg swelling, No hemoptysis, No surgery or trauma within 4 weeks, No prior DVT or PE and No hormone use (oral, transdermal and intravaginal estrogens)  Interpretation All eight criteria are met AND low clinical PE suspicion: No further evaluation for PE required   HEART SCORE (MDCalc Link)  Used for ACS Risk Stratification, risk of major cardiac event at 6 weeks Low Score = 0-3, <2% risk Moderate Score = 4-6, 12-16.6% risk  High Score = 7-10, 50-65% risk Risk Factors include: Hypercholesterolemia, Hypertension, Diabetes Mellitus, Cigarette smoking, Positive Family History, Obesity   History: 0 - Slightly suspicious ECG 1- Nonspecific repolarization disturbance Age 10 - (< 45) Risk Factors 1 - One to two risk factors Troponin 0 - </= normal limit  Score: 2  Six AJ, Backus BE, Kelder JC. Chest pain in the emergency room: value of the HEART score. Neth  Heart J. 2008 Jun;16(6):191-6. PubMed PMID: 81334796; PubMed Central PMCID: [id number].      __________________   ED Observation Note Patient placed in ED Observation Status on Date: 01/24/2020 time: 2:40 AM.    ED Observation Status: The reason for observation is to continue the patient's evaluation and treatment to determine if his clinical course requires a full hospital admission or if he can be safely discharged home. My plan is to continually reassess the patient for evidence of additional chest discomfort, shortness of breath, evidence of arrhythmia on monitor, or other potentially worrisome findings. We will also consider obtaining repeat ECGS as well as repeat cardiac enzymes, as needed.  ED Observation Progress:   Additionally, please refer to other progress notes during the patient's clinical course  4:00 AM PROGRESS NOTE:   Initial labs and troponin are unremarkable.  Chest x-ray shows no acute findings.  Patient's vital signs remained stable.  He has a heart score of 2.  We will continue to observe.  Plan on repeating troponin at the 3-hour mark.  5:59 AM PROGRESS NOTE:   Patient continues in normal sinus rhythm on the monitor.  He was sleeping.  When I reassessed him and he woke up, he stated that his chest pain was completely resolved.  Serial troponins were negative.  We will provide outpatient resources for cardiology.  Return precautions given.    ED Observation Discharge Summary: Clinical Course in the ED: Patient's vital signs remained stable in the emergency department he received 1 dose  of Toradol.  His chest pain completely resolved.  Serial troponins were negative.  The rest of his labs and imaging were also unremarkable. Final Diagnosis/Medical Decision Making: Chest pain.  Patient will be discharged home and will be provided outpatient resources for cardiology. Disposition: Discharge  Patient is discharged from ED Observation status on Date: 01/24/2020 time: 6:00  AM __________________  Disposition   Clinical Impression:    Diagnosis Comment Added By Time Added   Chest pain, unspecified type  Devere Tenna Britain, PA-C 01/24/2020  5:13 AM      Final Disposition: Discharge  History   Chief Complaint in Triage  Patient presents with  . Chest pain, non-traumatic    Pt states he was watching netflix and the right side of his jaw starting hurting and then his chest began hurting approx. ago, pt states its a sharp pain and radiates to the right side of his ribs, also c/o lower tooth pain    The historian is the patient   HPI:  Shannon Hardy is a 29 y.o. male who is otherwise healthy presenting with midsternal chest pain that started 45 minutes prior to ED arrival while he was watching television.  He denies similar symptoms in the past.  He states that the pain started radiating up the right neck and is now in his upper and lower teeth bilaterally.  He does not feel short of breath.  He does not feel dizzy or lightheaded at this time.  This pain is not worse with movements or deep inspiration.  No associated nausea/vomiting.  No abdominal pain.  No back pain.  He denies any recent cough or fever.  No history of PE/DVT.  No recent prolonged periods of travel/immobilizations.  No recent lower extremity pain/edema.  No hemoptysis.  He does not take steroids.  Patient does smoke.  No known family history for early coronary artery disease.  Patient does not have a history of hypercholesterolemia or diabetes or hypertension.  He denies drug use but states he does take narcotics for chronic neck pain, I am taking less now.  No other complaints.  Review of Systems: See HPI, all other systems reviewed and are otherwise negative  MEDICATIONS:  Patient's Medications   No medications on file    ALLERGIES: No Known Allergies  PAST MEDICAL HISTORY:  History reviewed. No pertinent past medical history.  PAST SURGICAL HISTORY:  Past Surgical  History:  Procedure Laterality Date  . NECK SURGERY      SOCIAL HISTORY:  Social History   Tobacco Use  . Smoking status: Current Every Day Smoker  . Smokeless tobacco: Never Used  Substance Use Topics  . Alcohol use: Not Currently     FAMILY HISTORY: No known early family history for coronary artery disease.  Physical Exam   ED Triage Vitals [01/24/20 0239]  Temp 98.5 F (36.9 C)  Temp Source Oral  Heart Rate 93  BP 158/91  Respirations 16  SpO2 98 %   Reviewed vital signs and nursing note as charted by RN.  Adult Medical Physical Exam Reviewed vital signs and nursing note as charted by RN.   CONSTITUTIONAL: Alert and oriented and responds appropriately to questions. Well-appearing; well-nourished HEAD: Normocephalic EYES: Conjunctivae clear, sclerae non-icteric ENT: No facial edema, mild bilateral TMJ tenderness, dentition intact NECK: Supple without meningismus CARD: Regular rate and rhythm; no murmurs, no clicks, no rubs, no gallops; symmetric distal pulses RESP: Normal chest excursion without splinting or tachypnea; breath sounds clear  and equal bilaterally; no wheezes, no rhonchi, no rales  ABD/GI: Non-distended; soft, non-tender, no rebound, no guarding; no palpable organomegaly or masses. EXT: Normal ROM in all joints; non-tender to palpation; no cyanosis, no effusions, no edema    SKIN: Normal color for age and race; warm; dry;  no acute lesions noted NEURO: Moves all extremities equally PSYCH: The patient's mood and manner are appropriate. Grooming and personal hygiene are appropriate.   Results   I personally reviewed the results in the chart as listed below  Results for orders placed or performed during the hospital encounter of 01/24/20  CBC with Differential  Result Value Ref Range   Differential Percent Diff %    Differential Absolute Diff Absolute    WBC 8.4 3.6 - 11.2 K/uL   RBC 4.27 4.06 - 5.63 M/uL   Hemoglobin 12.8 12.5 - 17.2 g/dL    Hematocrit 38 37 - 47 %   Mean Cell Volume 88 74 - 96 fL   Mean Cell Hemoglobin 30 26 - 33 pg   Mean Cell Hemoglobin Concentration 34 33 - 36 g/dL   RDW 86.5 87.6 - 82.9 %   Platelet Count 256 150 - 450 K/uL   Mean Platelet Volume 7.4 (L) 7.5 - 11.2 fL   Neutrophils 62 37 - 80 %   Lymphocytes 29 10 - 50 %   Monocytes 6 0 - 12 %   Eosinophils 2 0 - 7 %   Basophils 1 0 - 2 %   Neutrophils Absolute 5.2 1.3 - 9.0 K/uL   Lymphocytes Absolute 2.4 0.4 - 5.6 K/uL   Monocytes Absolute 0.5 0.0 - 1.3 K/uL   Eosinophils Absolute 0.2 0.0 - 0.8 K/uL   Basophils Absolute 0.0 0.0 - 0.2 K/uL   Nucleated RBCS 0 /100 WBC  BMP  Result Value Ref Range   Sodium 137 136 - 145 mmol/L   Potassium 4.3 3.5 - 5.1 mmol/L   Chloride 102 99 - 108 mmol/L   CO2 29 21 - 31 mmol/L   BUN 13 7 - 25 mg/dL   Creatinine 9.04 9.32 - 1.20 mg/dL   Glucose, Random 98 70 - 199 mg/dL   Calcium, Total 9.4 8.8 - 10.6 mg/dL   Osmolality (calculated) 274 270 - 295 mOsm/kg   Anion Gap 6 3 - 11  Troponin-I  Result Value Ref Range   Troponin-I <0.03 <0.05 ng/mL  EGFR (MDRD)  Result Value Ref Range   EGFR >60 mL/min/1.32m2  Troponin-I (3hr Repeat)  Result Value Ref Range   Troponin-I <0.03 <0.05 ng/mL   Imaging Results        XR Chest 2 Views (Final result)  Result time 01/24/20 02:54:15   Final result by Norleen Manus Bold, MD (01/24/20 02:54:15)            Impression:   Impression:   No evidence of acute cardiopulmonary abnormality.          Narrative:   Examination: Chest, PA and lateral views  History: Chest pain    Comparison: 10/13/2019  Findings:   The cardiomediastinal silhouette is within normal limits in size.   There is no focal pulmonary infiltrate, sizable pleural effusion, or pneumothorax.   Visualized osseous structures are intact.                    ECG Results        ECG 12 lead; (Final result)  Result time 01/24/20 02:53:11   Final result  Impression:    Normal sinus rhythm Biatrial enlargement Rightward axis Incomplete right bundle branch block Abnormal ECG When compared with ECG of 13-Oct-2019 17:15, Incomplete right bundle branch block is now Present Confirmed by TOBIE ARABIA M. ([id number]) on 01/24/2020 2:53:09 AM         Narrative:   Ventricular Rate: 98 Atrial Rate: 98 P-R Interval: 140 QRS Duration: 100 Q-T Interval: 352 QTC Calculation(Bazett): 449 P Axis: 77 R Axis: 94 T Axis: [address] [place], [place] 01/24/20 [phone]

## 2022-10-23 NOTE — ED Provider Notes (Signed)
 [facility]  ED Provider Note  Sameer Teeple 31 y.o. male DOB: 07/01/1991 MRN: [id number] History   Chief Complaint  Patient presents with  . Dental Pain    Left lower wisdom tooth causing dental pain.   HPI Wyn Nettle is a 31 y.o. male with a medical history as listed below who presents to the emergency department for evaluation of dental pain.  He reports that his left lower wisdom tooth is erupting.  He reports associated pain, swelling.  He reports has been trying to find a dentist.  Denies fever, chills, nausea vomiting, trismus.    History reviewed. No pertinent past medical history.  Past Surgical History:  Procedure Laterality Date  . Back surgery      Social History   Substance and Sexual Activity  Alcohol Use No   Social History   Tobacco Use  Smoking Status Every Day  . Packs/day: 1  . Types: Cigarettes  Smokeless Tobacco Not on file   E-Cigarettes  . Vaping Use    . Start Date    . Cartridges/Day    . Quit Date     Social History   Substance and Sexual Activity  Drug Use No         No Known Allergies  Discharge Medication List as of 10/23/2022  6:38 AM      Review of Systems   Review of Systems  Constitutional:  Negative for chills and fever.  HENT:  Positive for dental problem. Negative for ear pain and sore throat.   Eyes:  Negative for pain and visual disturbance.  Respiratory:  Negative for cough and shortness of breath.   Cardiovascular:  Negative for chest pain and palpitations.  Gastrointestinal:  Negative for abdominal pain and vomiting.  Genitourinary:  Negative for dysuria and hematuria.  Musculoskeletal:  Negative for arthralgias and back pain.  Skin:  Negative for color change and rash.  Neurological:  Negative for seizures and syncope.  All other systems reviewed and are negative.   Physical Exam   ED Triage Vitals  BP 10/23/22 0417 146/83  Heart Rate 10/23/22 0415 62  Resp  10/23/22 0415 15  SpO2 10/23/22 0415 98 %  Temp 10/23/22 0415 98 F (36.7 C)    Physical Exam  Nursing note and vitals reviewed. Constitutional: He appears well-developed and well-nourished. He does not appear distressed, does not appear ill and no respiratory distress. Not diaphoretic. HENT:  Head: Normocephalic and atraumatic.  Right Ear: Normal external ear. Normal ear canal.  Left Ear: Normal external ear. Normal ear canal.  Nose: Nose normal.  Mouth/Throat: Voice normal.  Diffuse tenderness of his left posterior dentition.  No evidence of variable abscess.  No trismus  Eyes: Pupils are equal, round, and reactive to light. Right eye: no conjunctival injection. Left eye: no conjunctival injection.  Neck: Normal range of motion and voice normal. Normal range of motion.  Cardiovascular: Normal rate, regular rhythm, normal heart sounds and intact distal pulses.  No audible murmur.  Pulmonary/Chest: No respiratory distress. Not tachypneic. Respiratory effort normal and breath sounds normal.  Abdominal: Soft. There is no abdominal tenderness. Abdomen not distended. No visible abdominal trauma.  Musculoskeletal: Normal range of motion. No obvious deformity noted to extremities.     Cervical back: Normal range of motion. Normal range of motion.   Neurological: He is alert and oriented to person, place, and time.  Skin: Skin is warm. Not diaphoretic. Skin is dry.  Psychiatric: He has  a normal mood and affect. His behavior is normal.    ED Course   Lab results: No data to display  Imaging: No data to display  ECG: ECG Results   None                               Pre-Sedation Procedures    Medical Decision Making I reviewed diagnosis with the patient. All questions answered. Patient is comfortable with the plan to go home and follow up as an outpatient.  Patient remains hemodynamically stable and ready for discharge.  Strict return precautions were discussed  and given in writing.   Risk Prescription drug management.       Provider Communication  Discharge Medication List as of 10/23/2022  6:38 AM     START taking these medications   Details  penicillin v potassium (VEETID) 500 MG tablet Take one tablet (500 mg dose) by mouth 4 (four) times daily for 7 days., Starting Fri 10/23/2022, Until Fri 10/30/2022, Normal        Discharge Medication List as of 10/23/2022  6:38 AM      Discharge Medication List as of 10/23/2022  6:38 AM      Clinical Impression   Final diagnoses:  Pain, dental    ED Disposition     ED Disposition  Discharge   Condition  Stable   Comment  --                   Electronically signed by:    Waddell PARAS Day, DO 10/30/22 1738

## 2022-11-25 ENCOUNTER — Emergency Department (HOSPITAL_COMMUNITY)
Admission: EM | Admit: 2022-11-25 | Discharge: 2022-11-25 | Discharge status: Against Medical Advice | Payer: Medicaid Other

## 2022-11-25 NOTE — ED Triage Notes (Signed)
The pt has been called  2-3 times no answer

## 2022-11-25 NOTE — ED Notes (Signed)
Called for triage x 2 with no answer. 

## 2022-11-25 NOTE — ED Notes (Signed)
Pt called 3x no answer  

## 2022-11-26 ENCOUNTER — Emergency Department (HOSPITAL_COMMUNITY): Payer: Medicaid Other

## 2022-11-26 ENCOUNTER — Emergency Department (HOSPITAL_COMMUNITY)
Admission: EM | Admit: 2022-11-26 | Discharge: 2022-11-26 | Discharge status: Against Medical Advice | Payer: No Typology Code available for payment source | Attending: Emergency Medicine | Admitting: Emergency Medicine

## 2022-11-26 ENCOUNTER — Encounter (HOSPITAL_COMMUNITY): Payer: Self-pay

## 2022-11-26 DIAGNOSIS — Z5321 Procedure and treatment not carried out due to patient leaving prior to being seen by health care provider: Secondary | ICD-10-CM | POA: Diagnosis not present

## 2022-11-26 DIAGNOSIS — Y92410 Unspecified street and highway as the place of occurrence of the external cause: Secondary | ICD-10-CM | POA: Insufficient documentation

## 2022-11-26 DIAGNOSIS — M542 Cervicalgia: Secondary | ICD-10-CM | POA: Insufficient documentation

## 2022-11-26 NOTE — ED Notes (Signed)
Patient did not answer for room x 3.  
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
## 2022-11-26 NOTE — ED Provider Triage Note (Signed)
Emergency Medicine Provider Triage Evaluation Note  Shannon Hardy , a 32 y.o. male  was evaluated in triage.  Pt complains of neck pain.    Review of Systems  Per HPI  Physical Exam  BP (!) 136/94 (BP Location: Right Arm)   Pulse 65   Temp 98.1 F (36.7 C) (Oral)   Resp 18   SpO2 100%  Gen:   Awake, no distress   Resp:  Normal effort  MSK:   Moves extremities without difficulty  Other:    Medical Decision Making  Medically screening exam initiated at 6:46 AM.  Appropriate orders placed.  Vincient Vanaman was informed that the remainder of the evaluation will be completed by another provider, this initial triage assessment does not replace that evaluation, and the importance of remaining in the ED until their evaluation is complete.     Sherrill Raring, [place] 11/26/22 [phone]

## 2023-04-18 NOTE — ED Provider Notes (Signed)
 [facility]  ED Provider Note  Natanel Snavely 32 y.o. male DOB: 26-Sep-1991 MRN: [id number]  Tele-Medical screening initiated and orders placed by DOROTHA JAYSON Simpers, PA. 04/18/2023 / 10:53 PM  32 y.o. male presents with complaints of n/v/d since Friday, when he ate bad chicken at tx center where he is currently staying.  Reports he is not the only 1 who got sick from that meal.  States that he is vomiting up everything he tries to eat or drink.  Continues to have 1-2 loose stools today despite not keeping anything down.  Denies blood in his emesis or stool.  No fever or chills.  No other complaints or concerns. NAD, afebrile, nontoxic in appearance.  Labs, UA, IV fluids, IV Zofran  ordered.  Patient declined Zofran  ODT.  Patient seen and received a tele-medical screening examination in triage.  The provider performing the medical screening exam was not located at the facility and was located remotely.  Patient understands that the provider is seeing them remotely and consents to the exam.  Additionally, the patient has been advised of the risks and benefits of a video visit that differ from in-person treatment such as: a limited physical examination, unforeseen disruptions to connectivity, risks to patient confidentiality and privacy. Patient was also explained the risks of the exam which include video or sound dysfunction, or minor portions of the exam performed in conjunction with ancillary staff in the ED.  Appropriate orders have been initiated based on my brief physical exam and HPI. Patient placed in appropriate area until a treatment room becomes available for further evaluation and management by the in-house provider.  This tele-medical screening exam was electronically signed by DOROTHA JAYSON Simpers, PA on 04/18/2023 at 10:53 PM  History   Chief Complaint  Patient presents with  . Abdominal Pain  . Nausea  . Emesis  . Diarrhea    Patient reports 3 day hx of n/v/d.  He  states he cannot eat anything.  He is also complaining of mid to upper abdominal pain.  Patient is coming from [facility].  No narcotics nor benzo for his treatment   HPI 32 y.o. male presents with complaints of n/v/d since Friday, when he ate bad chicken at tx center where he is currently staying.  Reports he is not the only 1 who got sick from that meal.  States that he is vomiting up everything he tries to eat or drink.  Continues to have 1-2 loose stools today despite not keeping anything down.  Denies blood in his emesis or stool.  No fever or chills.  No other complaints or concerns.    History reviewed. No pertinent past medical history.  Past Surgical History:  Procedure Laterality Date  . Back surgery    . Neck surgery      Social History   Substance and Sexual Activity  Alcohol Use No   Social History   Tobacco Use  Smoking Status Every Day  . Packs/day: 1  . Types: Cigarettes  Smokeless Tobacco Not on file   E-Cigarettes  . Vaping Use    . Start Date    . Cartridges/Day    . Quit Date     Social History   Substance and Sexual Activity  Drug Use No     Immunizations Up to Date?: Yes   No Known Allergies  Home Medications   No medications on file    Review of Systems   Review of Systems  Constitutional:  Negative for chills and fever.  HENT:  Negative for ear pain and sore throat.   Eyes:  Negative for pain and visual disturbance.  Respiratory:  Negative for cough and shortness of breath.   Cardiovascular:  Negative for chest pain and palpitations.  Gastrointestinal:  Positive for abdominal pain, diarrhea, nausea and vomiting.  Genitourinary:  Negative for dysuria and hematuria.  Musculoskeletal:  Negative for arthralgias and back pain.  Skin:  Negative for color change and rash.  Neurological:  Negative for seizures and syncope.  All other systems reviewed and are negative.   Physical Exam   ED Triage Vitals [04/18/23 2234]  BP (!) 152/101   Heart Rate 98  Resp 20  SpO2 98 %  Temp 98 F (36.7 C)    Physical Exam  Nursing note and vitals reviewed. Constitutional: He appears well-developed and well-nourished. He does not appear distressed, does not appear ill and no respiratory distress. Not diaphoretic. Uncomfortable appearing, non toxic, no acute distress, no respiratory distress   HENT:  Head: Normocephalic and atraumatic.  Right Ear: Normal external ear. Normal ear canal.  Left Ear: Normal external ear. Normal ear canal.  Nose: Nose normal.  Mouth/Throat: Voice normal.  Eyes: Pupils are equal, round, and reactive to light. Right eye: no conjunctival injection. Left eye: no conjunctival injection.  Neck: Normal range of motion and voice normal. Normal range of motion.  Cardiovascular: Normal rate, regular rhythm, normal heart sounds and intact distal pulses.  No audible murmur.  Pulmonary/Chest: No respiratory distress. Not tachypneic. Respiratory effort normal and breath sounds normal.  Abdominal: Soft. There is no abdominal tenderness. Abdomen not distended. No visible abdominal trauma.  Abdomen is soft, non distended, non tender. No guarding or rebound   Musculoskeletal: Normal range of motion. No obvious deformity noted to extremities.     Cervical back: Normal range of motion. Normal range of motion.   Neurological: He is alert and oriented to person, place, and time.  Skin: Skin is warm. Not diaphoretic. Skin is dry.  Psychiatric: He has a normal mood and affect. His behavior is normal.    ED Course   Lab results:   CBC AND DIFFERENTIAL - Abnormal      Result Value   WBC 10.7 (*)    RBC 4.54 (*)    HGB 13.5 (*)    HCT 40.4     MCV 89.0     MCH 29.7     MCHC 33.4     Plt Ct 266     RDW SD 43.5     MPV 10.3     NRBC% 0.0     Absolute NRBC Count 0.00     NEUTROPHIL % 78.7     LYMPHOCYTE % 14.9     MONOCYTE % 5.5     Eosinophil % 0.1     BASOPHIL % 0.4     IG% 0.4     ABSOLUTE NEUTROPHIL  COUNT 8.45 (*)    ABSOLUTE LYMPHOCYTE COUNT 1.60     Absolute Monocyte Count 0.59     Absolute Eosinophil Count 0.01     Absolute Basophil Count 0.04     Absolute Immature Granulocyte Count 0.04 (*)   COMPREHENSIVE METABOLIC PANEL - Abnormal   Na 140     Potassium 4.2     Cl 102     CO2 25     AGAP 13     Glucose 120 (*)    BUN 15  Creatinine 1.01     Ca 10.0     ALK PHOS 73     T Bili 0.44     Total Protein 7.6     Alb 4.6     GLOBULIN 3.0     ALBUMIN/GLOBULIN RATIO 1.5     BUN/CREAT RATIO 14.9     ALT 46     AST 22     eGFR 101     Comment: Normal GFR (glomerular filtration rate) > 60 mL/min/1.73 meters squared, < 60 may include impaired kidney function. Calculation based on the Chronic Kidney Disease Epidemiology Collaboration (CK-EPI)equation refit without adjustment for race.  LIPASE - Normal   Lipase 8    URINALYSIS W/MICRO REFLEX CULTURE - SYMPTOMATIC  LIGHT BLUE TOP  GOLD SST    Imaging: No data to display  ECG: ECG Results   None                               Pre-Sedation Procedures    Medical Decision Making I reviewed diagnosis with the patient. All questions answered. Patient is comfortable with the plan to go home and follow up as an outpatient.  Patient remains hemodynamically stable and ready for discharge.  Strict return precautions were discussed and given in writing.   Amount and/or Complexity of Data Reviewed Labs: ordered. ECG/medicine tests: ordered.  Risk Prescription drug management.       Provider Communication  New Prescriptions   No medications on file    Modified Medications   No medications on file    Discontinued Medications   No medications on file    Clinical Impression   Final diagnoses:  Gastroenteritis    ED Disposition     ED Disposition  Discharge   Condition  Stable   Comment  --                 Follow-up Information     [facility]  Medicine.   Comments: As needed or, If symptoms worsen Contact information: [address] [place]  [address] [phone]                 Electronically signed by:    Waddell PARAS Day, DO 04/19/23 9884

## 2023-07-06 ENCOUNTER — Emergency Department (HOSPITAL_COMMUNITY)
Admission: EM | Admit: 2023-07-06 | Discharge: 2023-07-06 | Disposition: A | Discharge status: Home / Self Care | Payer: MEDICAID | Attending: Emergency Medicine | Admitting: Emergency Medicine

## 2023-07-06 ENCOUNTER — Emergency Department (HOSPITAL_COMMUNITY): Payer: MEDICAID

## 2023-07-06 ENCOUNTER — Other Ambulatory Visit: Payer: Self-pay

## 2023-07-06 DIAGNOSIS — D72829 Elevated white blood cell count, unspecified: Secondary | ICD-10-CM | POA: Insufficient documentation

## 2023-07-06 DIAGNOSIS — T40411A Poisoning by fentanyl or fentanyl analogs, accidental (unintentional), initial encounter: Secondary | ICD-10-CM | POA: Insufficient documentation

## 2023-07-06 DIAGNOSIS — E876 Hypokalemia: Secondary | ICD-10-CM | POA: Diagnosis not present

## 2023-07-06 DIAGNOSIS — R7309 Other abnormal glucose: Secondary | ICD-10-CM | POA: Diagnosis not present

## 2023-07-06 DIAGNOSIS — E878 Other disorders of electrolyte and fluid balance, not elsewhere classified: Secondary | ICD-10-CM | POA: Diagnosis not present

## 2023-07-06 DIAGNOSIS — T50901A Poisoning by unspecified drugs, medicaments and biological substances, accidental (unintentional), initial encounter: Secondary | ICD-10-CM

## 2023-07-06 LAB — CBC WITH DIFFERENTIAL/PLATELET
Abs Immature Granulocytes: 0.04 10*3/uL (ref 0.00–0.07)
Basophils Absolute: 0.1 10*3/uL (ref 0.0–0.1)
Basophils Relative: 1 %
Eosinophils Absolute: 0.2 10*3/uL (ref 0.0–0.5)
Eosinophils Relative: 2 %
HCT: 45.2 % (ref 39.0–52.0)
Hemoglobin: 15.2 g/dL (ref 13.0–17.0)
Immature Granulocytes: 0 %
Lymphocytes Relative: 21 %
Lymphs Abs: 3 10*3/uL (ref 0.7–4.0)
MCH: 28.7 pg (ref 26.0–34.0)
MCHC: 33.6 g/dL (ref 30.0–36.0)
MCV: 85.4 fL (ref 80.0–100.0)
Monocytes Absolute: 0.8 10*3/uL (ref 0.1–1.0)
Monocytes Relative: 6 %
Neutro Abs: 9.9 10*3/uL — ABNORMAL HIGH (ref 1.7–7.7)
Neutrophils Relative %: 70 %
Platelets: 336 10*3/uL (ref 150–400)
RBC: 5.29 MIL/uL (ref 4.22–5.81)
RDW: 12.7 % (ref 11.5–15.5)
WBC: 14 10*3/uL — ABNORMAL HIGH (ref 4.0–10.5)
nRBC: 0 % (ref 0.0–0.2)

## 2023-07-06 LAB — BASIC METABOLIC PANEL
Anion gap: 12 (ref 5–15)
BUN: 7 mg/dL (ref 6–20)
CO2: 26 mmol/L (ref 22–32)
Calcium: 9.1 mg/dL (ref 8.9–10.3)
Chloride: 103 mmol/L (ref 98–111)
Creatinine, Ser: 1.18 mg/dL (ref 0.61–1.24)
GFR, Estimated: >60 mL/min (ref >60)
Glucose, Bld: 113 mg/dL — ABNORMAL HIGH (ref 70–99)
Potassium: 3.8 mmol/L (ref 3.5–5.1)
Sodium: 141 mmol/L (ref 135–145)

## 2023-07-06 LAB — COMPREHENSIVE METABOLIC PANEL
ALT: 24 U/L (ref 0–44)
AST: 30 U/L (ref 15–41)
Albumin: 5.1 g/dL — ABNORMAL HIGH (ref 3.5–5.0)
Alkaline Phosphatase: 62 U/L (ref 38–126)
Anion gap: 24 — ABNORMAL HIGH (ref 5–15)
BUN: 12 mg/dL (ref 6–20)
CO2: 22 mmol/L (ref 22–32)
Calcium: 11.1 mg/dL — ABNORMAL HIGH (ref 8.9–10.3)
Chloride: 94 mmol/L — ABNORMAL LOW (ref 98–111)
Creatinine, Ser: 1.17 mg/dL (ref 0.61–1.24)
GFR, Estimated: >60 mL/min (ref >60)
Glucose, Bld: 145 mg/dL — ABNORMAL HIGH (ref 70–99)
Potassium: 3.3 mmol/L — ABNORMAL LOW (ref 3.5–5.1)
Sodium: 140 mmol/L (ref 135–145)
Total Bilirubin: 1.5 mg/dL — ABNORMAL HIGH (ref 0.3–1.2)
Total Protein: 8.8 g/dL — ABNORMAL HIGH (ref 6.5–8.1)

## 2023-07-06 LAB — ETHANOL: Alcohol, Ethyl (B): <10 mg/dL (ref <10)

## 2023-07-06 LAB — LIPASE, BLOOD: Lipase: 71 U/L — ABNORMAL HIGH (ref 11–51)

## 2023-07-06 MED ORDER — SODIUM CHLORIDE 0.9 % IV SOLN
25.0000 mg | Freq: Once | INTRAVENOUS | Status: DC
  Filled 2023-07-06: qty 1

## 2023-07-06 MED ORDER — FAMOTIDINE 20 MG PO TABS
20.0000 mg | ORAL_TABLET | Freq: Two times a day (BID) | ORAL | 0 refills | Status: AC
Start: 2023-07-06 — End: ?

## 2023-07-06 MED ORDER — ONDANSETRON HCL 4 MG/2ML IJ SOLN
4.0000 mg | Freq: Once | INTRAMUSCULAR | Status: AC
  Administered 2023-07-06: 4 mg via INTRAVENOUS
  Filled 2023-07-06: qty 2

## 2023-07-06 MED ORDER — HALOPERIDOL LACTATE 5 MG/ML IJ SOLN
2.0000 mg | Freq: Once | INTRAMUSCULAR | Status: AC
  Administered 2023-07-06: 2 mg via INTRAVENOUS
  Filled 2023-07-06: qty 1

## 2023-07-06 MED ORDER — SODIUM CHLORIDE 0.9 % IV BOLUS
1000.0000 mL | Freq: Once | INTRAVENOUS | Status: AC
  Administered 2023-07-06: 1000 mL via INTRAVENOUS

## 2023-07-06 MED ORDER — ONDANSETRON 4 MG PO TBDP
8.0000 mg | ORAL_TABLET | Freq: Once | ORAL | Status: AC
  Administered 2023-07-06: 8 mg via ORAL
  Filled 2023-07-06: qty 2

## 2023-07-06 MED ORDER — ONDANSETRON HCL 4 MG PO TABS
4.0000 mg | ORAL_TABLET | Freq: Four times a day (QID) | ORAL | 0 refills | Status: AC
Start: 2023-07-06 — End: ?

## 2023-07-06 MED ORDER — HYDROMORPHONE HCL 1 MG/ML IJ SOLN: 0.5000 mg | Freq: Once | INTRAMUSCULAR | Status: DC

## 2023-07-06 NOTE — ED Triage Notes (Signed)
Pt BIB EMS. Overdose on fentanyl. Pt reports snorting as much as his normal amount.   EMS admin 4mg  narcan intranasally.  Persistently vomiting after.

## 2023-07-06 NOTE — Discharge Instructions (Addendum)
Your symptoms are from withdrawal, I have given you Zofran for nausea, Pepcid for stomach acid and stomach inflammation.  I would recommend slowly discontinuing fentanyl use, stopping all at once  will likely cause you to have you to have severe withdrawals, I would slowly taper it down.  I have also given you outpatient resources to help with fentanyl use.  May follow-up with behavioral health and/or the resources which have provided you to help with fentanyl use.  Come back to the emergency department if you develop chest pain, shortness of breath, severe abdominal pain, uncontrolled nausea, vomiting, diarrhea.

## 2023-07-06 NOTE — ED Provider Notes (Signed)
[place] EMERGENCY DEPARTMENT AT [facility] Provider Note   CSN: [id number] Arrival date & time: 07/06/23  0140     History  Chief Complaint  Patient presents with   Drug Overdose    Shannon Hardy is a 32 y.o. male.  HPI   Patient with medical history including polysubstance use, presents via EMS due to overdose.  Patient was unable to provide much HPI due to consistent nausea vomiting.  Per EMS, they were called out to the scene because patient had overdosed on fentanyl, apparently patient's roommates called because he took fentanyl and almost passed out.  Roommate provide him with 4 of Narcan intranasal.    Home Medications Prior to Admission medications   Medication Sig Start Date End Date Taking? Authorizing Provider  famotidine (PEPCID) 20 MG tablet Take 1 tablet (20 mg total) by mouth 2 (two) times daily. 07/06/23  Yes Carroll Sage, PA-C  ondansetron (ZOFRAN) 4 MG tablet Take 1 tablet (4 mg total) by mouth every 6 (six) hours. 07/06/23  Yes Carroll Sage, PA-C  clotrimazole (LOTRIMIN) 1 % cream Apply to affected area 2 times daily 04/13/18   Petrucelli, Pleas Koch, PA-C      Allergies    Patient has no known allergies.    Review of Systems   Review of Systems  Unable to perform ROS: Acuity of condition    Physical Exam Updated Vital Signs BP (!) 152/73   Pulse (!) 101   Resp (!) 34   SpO2 100%  Physical Exam Vitals and nursing note reviewed.  Constitutional:      General: He is in acute distress.     Appearance: He is not ill-appearing.     Comments: Presents in acute distress, nausea vomiting, unable to participate much HPI as he was actively vomiting.  HENT:     Head: Normocephalic and atraumatic.     Comments: No obvious trauma of the head no raccoon eyes or Battle sign present.    Nose: No congestion.     Mouth/Throat:     Mouth: Mucous membranes are moist.     Pharynx: Oropharynx is clear. No oropharyngeal exudate or posterior  oropharyngeal erythema.     Comments: No trismus no torticollis no oral trauma present. Eyes:     Conjunctiva/sclera: Conjunctivae normal.     Pupils: Pupils are equal, round, and reactive to light.  Cardiovascular:     Rate and Rhythm: Normal rate and regular rhythm.     Pulses: Normal pulses.     Heart sounds: No murmur heard.    No friction rub. No gallop.  Pulmonary:     Effort: No respiratory distress.     Breath sounds: No wheezing, rhonchi or rales.     Comments: Patient is tachypneic, lung sounds are clear bilaterally.  No new oxygen requirements  No obvious trauma to the chest chest is nontender Abdominal:     Palpations: Abdomen is soft.     Tenderness: There is no abdominal tenderness. There is no right CVA tenderness or left CVA tenderness.     Comments: No obvious trauma of the abdomen abdomen is soft nontender  Musculoskeletal:     Comments: Patient is moving his upper and lower extremities  Skin:    General: Skin is warm and dry.  Neurological:     Mental Status: He is alert.     Comments: No obvious facial asymmetry, no obvious unilateral weakness, patient is slightly altered, but is protecting his  airway.   Psychiatric:        Mood and Affect: Mood normal.     ED Results / Procedures / Treatments   Labs (all labs ordered are listed, but only abnormal results are displayed) Labs Reviewed  CBC WITH DIFFERENTIAL/PLATELET - Abnormal; Notable for the following components:      Result Value   WBC 14.0 (*)    Neutro Abs 9.9 (*)    All other components within normal limits  COMPREHENSIVE METABOLIC PANEL - Abnormal; Notable for the following components:   Potassium 3.3 (*)    Chloride 94 (*)    Glucose, Bld 145 (*)    Calcium 11.1 (*)    Total Protein 8.8 (*)    Albumin 5.1 (*)    Total Bilirubin 1.5 (*)    Anion gap 24 (*)    All other components within normal limits  LIPASE, BLOOD - Abnormal; Notable for the following components:   Lipase 71 (*)    All  other components within normal limits  BASIC METABOLIC PANEL - Abnormal; Notable for the following components:   Glucose, Bld 113 (*)    All other components within normal limits  ETHANOL  RAPID URINE DRUG SCREEN, HOSP PERFORMED    EKG EKG Interpretation Date/Time:  Tuesday July 06 2023 02:05:41 EDT Ventricular Rate:  85 PR Interval:  136 QRS Duration:  99 QT Interval:  405 QTC Calculation: 482 R Axis:   91  Text Interpretation: Sinus rhythm Biatrial enlargement Borderline right axis deviation RSR' in V1 or V2, probably normal variant ST elevation, consider lateral injury Borderline prolonged QT interval Confirmed by Gilda Crease [id number]) on 07/06/2023 2:20:43 AM  Radiology DG Chest 2 View  Result Date: 07/06/2023 CLINICAL DATA:  Fentanyl overdose with shortness of breath. EXAM: CHEST - 2 VIEW COMPARISON:  PA and lateral 02/20/2014 FINDINGS: The heart size and mediastinal contours are within normal limits. Both lungs are clear. The visualized skeletal structures are unremarkable. There is overlying monitor wiring IMPRESSION: No evidence of acute chest disease. Electronically Signed   By: Almira Bar M.D.   On: 07/06/2023 03:30   CT Head Wo Contrast  Result Date: 07/06/2023 CLINICAL DATA:  Mental status change, unknown cause EXAM: CT HEAD WITHOUT CONTRAST TECHNIQUE: Contiguous axial images were obtained from the base of the skull through the vertex without intravenous contrast. RADIATION DOSE REDUCTION: This exam was performed according to the departmental dose-optimization program which includes automated exposure control, adjustment of the mA and/or kV according to patient size and/or use of iterative reconstruction technique. COMPARISON:  None Available. FINDINGS: Brain: No evidence of large-territorial acute infarction. No parenchymal hemorrhage. No mass lesion. No extra-axial collection. No mass effect or midline shift. No hydrocephalus. Basilar cisterns are patent.  Vascular: No hyperdense vessel. Skull: No acute fracture or focal lesion. Sinuses/Orbits: Paranasal sinuses and mastoid air cells are clear. The orbits are unremarkable. Other: Redemonstration of partially visualized dense surgical hardware. IMPRESSION: No acute intracranial abnormality. Electronically Signed   By: Tish Frederickson M.D.   On: 07/06/2023 02:39    Procedures Procedures    Medications Ordered in ED Medications  ondansetron (ZOFRAN-ODT) disintegrating tablet 8 mg (has no administration in time range)  sodium chloride 0.9 % bolus 1,000 mL (0 mLs Intravenous Stopped 07/06/23 0515)  ondansetron (ZOFRAN) injection 4 mg (4 mg Intravenous Given 07/06/23 0207)  haloperidol lactate (HALDOL) injection 2 mg (2 mg Intravenous Given 07/06/23 0246)  sodium chloride 0.9 % bolus 1,000 mL (0 mLs  Intravenous Stopped 07/06/23 0515)    ED Course/ Medical Decision Making/ A&P                                 Medical Decision Making Amount and/or Complexity of Data Reviewed Labs: ordered. Radiology: ordered.  Risk Prescription drug management.   This patient presents to the ED for concern of overdose, this involves an extensive number of treatment options, and is a complaint that carries with it a high risk of complications and morbidity.  The differential diagnosis includes metabolic abnormality, acute psychiatric emergency, intracranial bleed, ACS, PE    Additional history obtained:  Additional history obtained from EMS External records from outside source obtained and reviewed including recent ER notes   Co morbidities that complicate the patient evaluation  Polysubstance use  Social Determinants of Health:  No PCP    Lab Tests:  I Ordered, and personally interpreted labs.  The pertinent results include: Leukocytosis white count of 14, CMP reveals potassium 3.3 chloride 94 glucose 145 calcium 11.1 protein 8.8 albumin 5.1 total T. bili 1.5 anion gap of 24, lipase 71, ethanol  negative   Imaging Studies ordered:  I ordered imaging studies including chest x-ray, CT head I independently visualized and interpreted imaging which showed CT head negative acute findings I agree with the radiologist interpretation   Cardiac Monitoring:  The patient was maintained on a cardiac monitor.  I personally viewed and interpreted the cardiac monitored which showed an underlying rhythm of: Sinus not signs of ischemia   Medicines ordered and prescription drug management:  I ordered medication including fluids, antiemetics I have reviewed the patients home medicines and have made adjustments as needed  Critical Interventions:  N/a   Reevaluation:  Presents as a overdose, exam is extremely limited as patient was actively vomiting, he is unable to tell me very much appears to be slightly altered, will obtain CT head rule possible head bleed, and obtain screening lab workup.  Patient is actively vomiting despite initial antiemetics, he appears to be suffering from withdrawals, hyperventilating, will Haldol to assist with both nausea as well as withdrawal symptoms.  Reassessed after Haldol, resting more comfortably, no longer actively vomiting, will continue to monitor.  Patient is reassessed resting comfortably he is more alert, states that he took some fentanyl earlier today, states his normal amount, this is not an intent to hurt himself, currently has no complaints, will ambulate p.o. challenge and repeat basic metabolic panel.  Patient still resting comfortably, states she is feeling much better, tolerating p.o., vital signs reassuring, he is in agreement with discharge at this time.   Consultations Obtained:  N/a    Test Considered:  N/a    Rule out Doubt CVA or intracranial bleed CT imaging is negative these findings there is no focal is present my exam.  Doubt ACS patient has no chest pain, EKG without signs of ischemia presentation atypical etiology.  I  doubt PE patient is PERC negative.  Suspicion for acute psychiatric emergency is low at this time not endorsing any SI or HI, does not appear to be respond to internal stimuli.  It is noted the patient had a anion gap of 24, this is since resolved, this is likely secondary due to dehydration.  Suspicion patient has been admitted for acute withdrawals is low at this time nontremulous on my exam vital signs reassuring is tolerating p.o.    Dispostion and problem  list  After consideration of the diagnostic results and the patients response to treatment, I feel that the patent would benefit from discharge.  Overdose-accidental, will provide him with outpatient resources, given counseling to stop using fentanyl.              Final Clinical Impression(s) / ED Diagnoses Final diagnoses:  Accidental overdose, initial encounter    Rx / DC Orders ED Discharge Orders          Ordered    ondansetron (ZOFRAN) 4 MG tablet  Every 6 hours        07/06/23 0629    famotidine (PEPCID) 20 MG tablet  2 times daily        07/06/23 0629              Carroll Sage, PA-C 07/06/23 6213    Gilda Crease, MD 07/07/23 0700
